# Patient Record
Sex: Female | Born: 1980 | Race: Black or African American | Hispanic: No | Smoking: Current some day smoker
Health system: Southern US, Community
[De-identification: ages and names within clinical notes are randomized; demographics above are authoritative.]

## PROBLEM LIST (undated history)

## (undated) ENCOUNTER — Ambulatory Visit (HOSPITAL_COMMUNITY): Source: Home / Self Care

## (undated) DIAGNOSIS — O009 Unspecified ectopic pregnancy without intrauterine pregnancy: Secondary | ICD-10-CM

## (undated) DIAGNOSIS — R112 Nausea with vomiting, unspecified: Secondary | ICD-10-CM

## (undated) DIAGNOSIS — F53 Postpartum depression: Secondary | ICD-10-CM

## (undated) DIAGNOSIS — Z9889 Other specified postprocedural states: Secondary | ICD-10-CM

## (undated) DIAGNOSIS — O99345 Other mental disorders complicating the puerperium: Secondary | ICD-10-CM

## (undated) DIAGNOSIS — D573 Sickle-cell trait: Secondary | ICD-10-CM

## (undated) DIAGNOSIS — G971 Other reaction to spinal and lumbar puncture: Secondary | ICD-10-CM

## (undated) DIAGNOSIS — K56600 Partial intestinal obstruction, unspecified as to cause: Secondary | ICD-10-CM

---

## 2001-09-21 HISTORY — PX: ECTOPIC PREGNANCY SURGERY: SHX613

## 2002-09-21 HISTORY — PX: UNILATERAL SALPINGECTOMY: SHX6160

## 2013-02-01 LAB — OB RESULTS CONSOLE ANTIBODY SCREEN: Antibody Screen: NEGATIVE

## 2013-02-01 LAB — OB RESULTS CONSOLE RUBELLA ANTIBODY, IGM: Rubella: IMMUNE

## 2013-02-01 LAB — OB RESULTS CONSOLE HEPATITIS B SURFACE ANTIGEN: Hepatitis B Surface Ag: NEGATIVE

## 2013-02-01 LAB — OB RESULTS CONSOLE HIV ANTIBODY (ROUTINE TESTING): HIV: NONREACTIVE

## 2013-02-14 LAB — OB RESULTS CONSOLE RPR: RPR: NONREACTIVE

## 2013-09-27 ENCOUNTER — Inpatient Hospital Stay (HOSPITAL_COMMUNITY)
Admission: AD | Admit: 2013-09-27 | Discharge: 2013-09-27 | Disposition: A | Discharge status: Home / Self Care | Payer: 59 | Source: Ambulatory Visit | Attending: Obstetrics & Gynecology | Admitting: Obstetrics & Gynecology

## 2013-09-27 ENCOUNTER — Emergency Department (HOSPITAL_COMMUNITY)
Admission: EM | Admit: 2013-09-27 | Discharge: 2013-09-27 | Disposition: A | Discharge status: Other Acute Inpatient Hospital | Payer: 59 | Source: Home / Self Care | Attending: Family Medicine | Admitting: Family Medicine

## 2013-09-27 ENCOUNTER — Encounter (HOSPITAL_COMMUNITY): Payer: Self-pay | Admitting: General Practice

## 2013-09-27 ENCOUNTER — Inpatient Hospital Stay (HOSPITAL_COMMUNITY): Payer: 59

## 2013-09-27 ENCOUNTER — Encounter (HOSPITAL_COMMUNITY): Payer: Self-pay | Admitting: Emergency Medicine

## 2013-09-27 DIAGNOSIS — Z3490 Encounter for supervision of normal pregnancy, unspecified, unspecified trimester: Secondary | ICD-10-CM

## 2013-09-27 DIAGNOSIS — O99891 Other specified diseases and conditions complicating pregnancy: Secondary | ICD-10-CM

## 2013-09-27 DIAGNOSIS — M549 Dorsalgia, unspecified: Secondary | ICD-10-CM

## 2013-09-27 DIAGNOSIS — O9989 Other specified diseases and conditions complicating pregnancy, childbirth and the puerperium: Secondary | ICD-10-CM

## 2013-09-27 DIAGNOSIS — O99330 Smoking (tobacco) complicating pregnancy, unspecified trimester: Secondary | ICD-10-CM | POA: Insufficient documentation

## 2013-09-27 DIAGNOSIS — O98819 Other maternal infectious and parasitic diseases complicating pregnancy, unspecified trimester: Secondary | ICD-10-CM | POA: Insufficient documentation

## 2013-09-27 DIAGNOSIS — R109 Unspecified abdominal pain: Secondary | ICD-10-CM | POA: Insufficient documentation

## 2013-09-27 DIAGNOSIS — N898 Other specified noninflammatory disorders of vagina: Secondary | ICD-10-CM

## 2013-09-27 DIAGNOSIS — N939 Abnormal uterine and vaginal bleeding, unspecified: Secondary | ICD-10-CM

## 2013-09-27 DIAGNOSIS — A5900 Urogenital trichomoniasis, unspecified: Secondary | ICD-10-CM

## 2013-09-27 DIAGNOSIS — A5901 Trichomonal vulvovaginitis: Secondary | ICD-10-CM | POA: Insufficient documentation

## 2013-09-27 HISTORY — DX: Other specified postprocedural states: Z98.890

## 2013-09-27 HISTORY — DX: Other reaction to spinal and lumbar puncture: G97.1

## 2013-09-27 HISTORY — DX: Other specified postprocedural states: R11.2

## 2013-09-27 HISTORY — DX: Sickle-cell trait: D57.3

## 2013-09-27 HISTORY — DX: Unspecified ectopic pregnancy without intrauterine pregnancy: O00.90

## 2013-09-27 LAB — ABO/RH: ABO/RH(D): O POS

## 2013-09-27 LAB — WET PREP, GENITAL: Yeast Wet Prep HPF POC: NONE SEEN

## 2013-09-27 LAB — CBC
HCT: 32.6 % — ABNORMAL LOW (ref 36.0–46.0)
Hemoglobin: 10.9 g/dL — ABNORMAL LOW (ref 12.0–15.0)
MCH: 29.4 pg (ref 26.0–34.0)
MCHC: 33.4 g/dL (ref 30.0–36.0)
MCV: 87.9 fL (ref 78.0–100.0)
Platelets: 262 10*3/uL (ref 150–400)
RBC: 3.71 MIL/uL — ABNORMAL LOW (ref 3.87–5.11)
RDW: 13.7 % (ref 11.5–15.5)
WBC: 6.8 10*3/uL (ref 4.0–10.5)

## 2013-09-27 LAB — POCT PREGNANCY, URINE: Preg Test, Ur: POSITIVE — AB

## 2013-09-27 LAB — HCG, QUANTITATIVE, PREGNANCY: HCG, BETA CHAIN, QUANT, S: 110312 m[IU]/mL — AB (ref <5)

## 2013-09-27 MED ORDER — PROMETHAZINE HCL 25 MG PO TABS: 25.0000 mg | ORAL_TABLET | Freq: Four times a day (QID) | ORAL | Status: DC | PRN

## 2013-09-27 MED ORDER — CYCLOBENZAPRINE HCL 10 MG PO TABS: 10.0000 mg | ORAL_TABLET | Freq: Two times a day (BID) | ORAL | Status: DC | PRN

## 2013-09-27 MED ORDER — SODIUM CHLORIDE 0.9 % IV SOLN
Freq: Once | INTRAVENOUS | Status: AC
  Administered 2013-09-27: 15:00:00 via INTRAVENOUS

## 2013-09-27 MED ORDER — PRENATAL MULTIVITAMIN CH: 1.0000 | ORAL_TABLET | Freq: Every day | ORAL | Status: DC

## 2013-09-27 MED ORDER — HYDROMORPHONE HCL PF 1 MG/ML IJ SOLN
1.0000 mg | Freq: Once | INTRAMUSCULAR | Status: AC
  Administered 2013-09-27: 1 mg via INTRAVENOUS
  Filled 2013-09-27: qty 1

## 2013-09-27 MED ORDER — PROMETHAZINE HCL 25 MG PO TABS
25.0000 mg | ORAL_TABLET | Freq: Once | ORAL | Status: AC
  Administered 2013-09-27: 25 mg via ORAL
  Filled 2013-09-27: qty 1

## 2013-09-27 MED ORDER — METRONIDAZOLE 500 MG PO TABS
2000.0000 mg | ORAL_TABLET | Freq: Once | ORAL | Status: AC
  Administered 2013-09-27: 2000 mg via ORAL
  Filled 2013-09-27: qty 4

## 2013-09-27 NOTE — ED Provider Notes (Signed)
CSN: [id number]     Arrival date & time 09/27/13  1251 History   None    Chief Complaint  Patient presents with  . Vaginal Bleeding   (Consider location/radiation/quality/duration/timing/severity/associated sxs/prior Treatment) Patient is a 33 y.o. female presenting with vaginal bleeding. The history is provided by the patient.  Vaginal Bleeding Quality:  Dark red and heavier than menses Severity:  Moderate Onset quality:  Sudden Duration:  1 day Progression:  Improving Chronicity:  New Possible pregnancy: yes (+home pregnancy test )   Associated symptoms: back pain   Associated symptoms comment:  +abdominal pain and cramping Risk factors: hx of ectopic pregnancy and unprotected sex   Risk factors comment:  2 prior ectopic pregnancies   History reviewed. No pertinent past medical history. History reviewed. No pertinent past surgical history. History reviewed. No pertinent family history. History  Substance Use Topics  . Smoking status: Current Every Day Smoker  . Smokeless tobacco: Not on file  . Alcohol Use: No   OB History   Grav Para Term Preterm Abortions TAB SAB Ect Mult Living                 Review of Systems  Genitourinary: Positive for vaginal bleeding.  Musculoskeletal: Positive for back pain.  All other systems reviewed and are negative.    Allergies  Review of patient's allergies indicates no known allergies.  Home Medications  No current outpatient prescriptions on file. BP 125/78  Pulse 72  Temp(Src) 98.4 F (36.9 C) (Oral)  Resp 18  SpO2 100% Physical Exam  Nursing note and vitals reviewed. Constitutional: She is oriented to person, place, and time. She appears well-developed and well-nourished. No distress.  HENT:  Head: Normocephalic and atraumatic.  Eyes: Conjunctivae are normal. No scleral icterus.  Cardiovascular: Normal rate, regular rhythm and normal heart sounds.   Pulmonary/Chest: Effort normal and breath sounds normal. No  respiratory distress.  Abdominal: Soft. Bowel sounds are normal. She exhibits no distension. There is no rigidity, no rebound, no guarding and no CVA tenderness.  Musculoskeletal: Normal range of motion.  Neurological: She is alert and oriented to person, place, and time.  Skin: Skin is warm and dry.  Psychiatric: She has a normal mood and affect. Her behavior is normal.    ED Course  Procedures (including critical care time) Labs Review Labs Reviewed  POCT PREGNANCY, URINE   Imaging Review No results found.  EKG Interpretation    Date/Time:    Ventricular Rate:    PR Interval:    QRS Duration:   QT Interval:    QTC Calculation:   R Axis:     Text Interpretation:              MDM  UPT positive. [facility]. Hemodynamically stable at time of transfer. [facility] Wynelle BourgeoisMarie Williams[facility] Accepting MD: Dr. Colette RibasJim Arnold    Elveta Rape Lee DenningPresson, GeorgiaPA 09/27/13 [phone]

## 2013-09-27 NOTE — Progress Notes (Signed)
Small amount of pink discharge noted

## 2013-09-27 NOTE — Discharge Instructions (Signed)
Trichinosis Trichinosis is an infection caused by eating the raw, or poorly cooked, meat of meat-eating animals that are infected. The infection is caused by eating the encysted larvae (like a small egg with a tiny worm inside) of the nematode (very small worm) called Trichinella spiralis. It is found in the infected meat of these animals. The seriousness of the disease is usually related to the number of larvae eaten. Once the cyst is in the intestines, the larva is released. These tiny worms then enter the small blood vessels in the intestines and travel throughout the body. They can infect all tissues of the body. SYMPTOMS  When the larvae are in the intestine, they can cause diarrhea and abdominal (belly) pain. There may be swelling around the eyes and muscle aches and pains. The diaphragm (breathing muscle between the chest and abdomen), chest muscles between the ribs, and the muscles of the face and the tongue are also affected. This disease is usually self limited. That means you will usually get well in time without treatment. It can rarely result in death only if there are complications from heavy invasion of the heart, lungs, or central nervous system (brain and spinal cord). DIAGNOSIS  Laboratory blood tests are available that can usually make a positive diagnosis. Examining the infected muscle under the microscope may also help with the diagnosis. If laboratory work is performed, make sure you know how you are to obtain the results. It is your responsibility to follow up and obtain your laboratory results. TREATMENT   There is no known treatment for Trichinosis except for treatment of symptoms. Usually anti-inflammatory medications are used.  Only take over-the-counter or prescription medicines for pain, discomfort, or fever as directed by your caregiver. Discontinue immediately if you have stomach upset. Take medicine only as directed by your caregiver.  Thiabendazole is used as a medication  for known exposure. Steroids are also used for severe cases.  Most symptoms will get better on their own within a year without lasting problems. However, you will be infected for the rest of your life. You cannot pass this infection on to another person even with close personal contact. SEEK IMMEDIATE MEDICAL CARE IF:  You develop any new symptoms such as vomiting, severe headache, stiff or painful neck, chest pain, shortness of breath, trouble breathing, or develop pain uncontrolled with medications.  You develop new problems or worsening of the problems that originally brought you in for care. Document Released: 12/14/2000 Document Revised: 11/30/2011 Document Reviewed: 01/12/2008 [facility] Patient Information 2014 [place], [place].

## 2013-09-27 NOTE — MAU Provider Note (Signed)
History     CSN: [id number]  Arrival date and time: 09/27/13 1513   None     Chief Complaint  Patient presents with  . Vaginal Bleeding  . Back Pain   HPI Comments: Patricia Holloway 33 y.o. Arrives via [facility] from [facility]. She has a positive home pregnancy test and started bleeding like menses. Now it is only pink . She had sexual intercourse 3 days ago. She has a history of 2 ectopic pregnancies. She complains of vaginal discharge.     Vaginal Bleeding Associated symptoms include abdominal pain and back pain.  Back Pain Associated symptoms include abdominal pain.      Past Medical History  Diagnosis Date  . Ectopic pregnancy     Past Surgical History  Procedure Laterality Date  . Ectopic pregnancy surgery      No family history on file.  History  Substance Use Topics  . Smoking status: Current Every Day Smoker  . Smokeless tobacco: Not on file  . Alcohol Use: No    Allergies: No Known Allergies  No prescriptions prior to admission    Review of Systems  Constitutional: Negative.   HENT: Negative.   Respiratory: Negative.   Cardiovascular: Negative.   Gastrointestinal: Positive for abdominal pain.  Genitourinary: Positive for vaginal bleeding.       Pink vaginal discharge with odor  Musculoskeletal: Positive for back pain.  Skin: Negative.   Neurological: Negative.   Psychiatric/Behavioral: Negative.    Physical Exam   Last menstrual period 07/24/2013, SpO2 100.00%.  Physical Exam  Constitutional: She is oriented to person, place, and time. She appears well-developed and well-nourished. No distress.  HENT:  Head: Normocephalic and atraumatic.  Eyes: Pupils are equal, round, and reactive to light.  GI: Soft. Bowel sounds are normal. She exhibits no distension. There is tenderness.  Genitourinary:  Genitalia: External: Negative Vagina: Pink, odorous discharge Cervix: closed and long Biman: negative  Neurological: She is alert  and oriented to person, place, and time.  Skin: Skin is warm and dry.  Psychiatric: She has a normal mood and affect. Her behavior is normal. Judgment and thought content normal.   Results for orders placed during the hospital encounter of 09/27/13 (from the past 24 hour(s))  CBC     Status: Abnormal   Collection Time    09/27/13  3:47 PM      Result Value Range   WBC 6.8  4.0 - 10.5 K/uL   RBC 3.71 (*) 3.87 - 5.11 MIL/uL   Hemoglobin 10.9 (*) 12.0 - 15.0 g/dL   HCT 78.2 (*) 95.6 - 21.3 %   MCV 87.9  78.0 - 100.0 fL   MCH 29.4  26.0 - 34.0 pg   MCHC 33.4  30.0 - 36.0 g/dL   RDW 08.6  57.8 - 46.9 %   Platelets 262  150 - 400 K/uL  HCG, QUANTITATIVE, PREGNANCY     Status: Abnormal   Collection Time    09/27/13  3:47 PM      Result Value Range   hCG, Beta Chain, [place] Longest 629528 (*) <5 mIU/mL  ABO/RH     Status: None   Collection Time    09/27/13  3:47 PM      Result Value Range   ABO/RH(D) O POS    WET PREP, GENITAL     Status: Abnormal   Collection Time    09/27/13  4:35 PM      Result Value Range  Yeast Wet Prep HPF POC NONE SEEN  NONE SEEN   Trich, Wet Prep MODERATE (*) NONE SEEN   Clue Cells Wet Prep HPF POC FEW (*) NONE SEEN   WBC, Wet Prep HPF POC FEW (*) NONE SEEN  No results found. [place] Ob Comp Less 14 Wks  09/27/2013   CLINICAL DATA:  Vaginal bleeding.  Obesity.  EXAM: OBSTETRIC <14 WK ULTRASOUND  TECHNIQUE: Transabdominal ultrasound was performed for evaluation of the gestation as well as the maternal uterus and adnexal regions.  COMPARISON:  None.  FINDINGS: Intrauterine gestational sac: Single gestational sac in the fundus.  Yolk sac:  Present.  Embryo:  Present.  Cardiac Activity: Present.  Heart Rate: 153 bpm  CRL:   47  mm   11 w 4 d                  US EDC: 04/14/2014  Maternal uterus/adnexae: Ovaries are unremarkable. No pelvic mass. No free fluid.  IMPRESSION: Single live intrauterine gestation with an estimated gestational age of [redacted] weeks and 4 days. Fetal heart  rate is 153 beats per min.   Electronically Signed   By: Maryclare BeanArt  Hoss M.D.   On: 09/27/2013 18:25     MAU Course  Procedures  MDM  CBC, BHCG, ABORH, GC, Chlamydia, Wet prep  Assessment and Plan   A: Pregnancy/ IUP Vaginal Trich Back Pains   P: Will treat Trich with Flagyl 2 Grams now with phenergan 25 mg Advise FOB to be treated for Trich Advised to start PNV and find prenatal care  Flexeril 10 mg for back pains   Patricia Holloway, Patricia Holloway 09/27/2013, 3:35 PM

## 2013-09-27 NOTE — ED Notes (Addendum)
Late  On  Her  Period         Positive  preg  Test  At home  A  Few  Days  Ago            Some  Low  abd  Pain  Mild     As  Well  As  Some  Back  Pain       Spotting  Some  Now    History  Of   2 previous  Ectopic

## 2013-09-27 NOTE — ED Notes (Signed)
Iv  Ns  tko  20  Angio  r  Arm  1tko   cardiavc  Monitor  Nasal o2  At  2 l /  min

## 2013-09-27 NOTE — MAU Note (Signed)
Pt was transported to MAU via [facility] to r/o ectopic pregnancy. Pt states she's had vaginal bleeding since around 11:45 today when she noticed there was blood on the toilet paper when she went to the bathroom. Pt states she's not seen any other bleeding. Pt states back pain started today that shoots up and down her right leg. Pt states they both started about the same time.

## 2013-09-28 LAB — GC/CHLAMYDIA PROBE AMP
CT Probe RNA: NEGATIVE
GC PROBE AMP APTIMA: NEGATIVE

## 2013-10-01 NOTE — ED Provider Notes (Signed)
Medical screening examination/treatment/procedure(s) were performed by resident physician or non-physician practitioner and as supervising physician I was immediately available for consultation/collaboration.   Itzy Adler DOUGLAS MD.   Renzo Vincelette D Kit Brubacher, MD 10/01/13 1037 

## 2013-12-06 ENCOUNTER — Encounter (HOSPITAL_COMMUNITY): Payer: Self-pay | Admitting: General Practice

## 2013-12-06 ENCOUNTER — Inpatient Hospital Stay (HOSPITAL_COMMUNITY)
Admission: AD | Admit: 2013-12-06 | Discharge: 2013-12-06 | Disposition: A | Discharge status: Home / Self Care | Payer: 59 | Source: Ambulatory Visit | Attending: Obstetrics & Gynecology | Admitting: Obstetrics & Gynecology

## 2013-12-06 DIAGNOSIS — R209 Unspecified disturbances of skin sensation: Secondary | ICD-10-CM | POA: Insufficient documentation

## 2013-12-06 DIAGNOSIS — Z202 Contact with and (suspected) exposure to infections with a predominantly sexual mode of transmission: Secondary | ICD-10-CM | POA: Insufficient documentation

## 2013-12-06 DIAGNOSIS — H538 Other visual disturbances: Secondary | ICD-10-CM | POA: Insufficient documentation

## 2013-12-06 DIAGNOSIS — F17200 Nicotine dependence, unspecified, uncomplicated: Secondary | ICD-10-CM | POA: Insufficient documentation

## 2013-12-06 DIAGNOSIS — A5901 Trichomonal vulvovaginitis: Secondary | ICD-10-CM

## 2013-12-06 DIAGNOSIS — G43909 Migraine, unspecified, not intractable, without status migrainosus: Secondary | ICD-10-CM

## 2013-12-06 LAB — URINALYSIS, ROUTINE W REFLEX MICROSCOPIC
Bilirubin Urine: NEGATIVE
Glucose, UA: NEGATIVE mg/dL
Hgb urine dipstick: NEGATIVE
KETONES UR: 15 mg/dL — AB
LEUKOCYTES UA: NEGATIVE
NITRITE: NEGATIVE
PH: 7 (ref 5.0–8.0)
Protein, ur: NEGATIVE mg/dL
Specific Gravity, Urine: 1.02 (ref 1.005–1.030)
Urobilinogen, UA: 0.2 mg/dL (ref 0.0–1.0)

## 2013-12-06 LAB — RAPID URINE DRUG SCREEN, HOSP PERFORMED
Amphetamines: NOT DETECTED
Barbiturates: NOT DETECTED
Benzodiazepines: NOT DETECTED
Cocaine: NOT DETECTED
Opiates: POSITIVE — AB
Tetrahydrocannabinol: POSITIVE — AB

## 2013-12-06 LAB — WET PREP, GENITAL
Trich, Wet Prep: NONE SEEN
YEAST WET PREP: NONE SEEN

## 2013-12-06 MED ORDER — OXYCODONE-ACETAMINOPHEN 5-325 MG PO TABS
1.0000 | ORAL_TABLET | Freq: Once | ORAL | Status: AC
  Administered 2013-12-06: 1 via ORAL
  Filled 2013-12-06: qty 1

## 2013-12-06 MED ORDER — METRONIDAZOLE 500 MG PO TABS: 2000.0000 mg | ORAL_TABLET | Freq: Once | ORAL | Status: DC

## 2013-12-06 MED ORDER — PROMETHAZINE HCL 25 MG PO TABS: 25.0000 mg | ORAL_TABLET | Freq: Four times a day (QID) | ORAL | Status: DC | PRN

## 2013-12-06 MED ORDER — PROMETHAZINE HCL 25 MG PO TABS
25.0000 mg | ORAL_TABLET | Freq: Once | ORAL | Status: AC
  Administered 2013-12-06: 25 mg via ORAL
  Filled 2013-12-06: qty 1

## 2013-12-06 NOTE — MAU Note (Signed)
Pt states R leg is numb, leg gave way this a.m. When she got up to the BR.  Feels like she has a pinched nerve in her R flank area, then her leg goes numb.  Has HA & blurry vision, also some pelvic pressure.  Denies bleeding or discharge.

## 2013-12-06 NOTE — Discharge Instructions (Signed)
Migraine Headache A migraine headache is an intense, throbbing pain on one or both sides of your head. A migraine can last for 30 minutes to several hours. CAUSES  The exact cause of a migraine headache is not always known. However, a migraine may be caused when nerves in the brain become irritated and release chemicals that cause inflammation. This causes pain. Certain things may also trigger migraines, such as:  Alcohol.  Smoking.  Stress.  Menstruation.  Aged cheeses.  Foods or drinks that contain nitrates, glutamate, aspartame, or tyramine.  Lack of sleep.  Chocolate.  Caffeine.  Hunger.  Physical exertion.  Fatigue.  Medicines used to treat chest pain (nitroglycerine), birth control pills, estrogen, and some blood pressure medicines. SIGNS AND SYMPTOMS  Pain on one or both sides of your head.  Pulsating or throbbing pain.  Severe pain that prevents daily activities.  Pain that is aggravated by any physical activity.  Nausea, vomiting, or both.  Dizziness.  Pain with exposure to bright lights, loud noises, or activity.  General sensitivity to bright lights, loud noises, or smells. Before you get a migraine, you may get warning signs that a migraine is coming (aura). An aura may include:  Seeing flashing lights.  Seeing bright spots, halos, or zig-zag lines.  Having tunnel vision or blurred vision.  Having feelings of numbness or tingling.  Having trouble talking.  Having muscle weakness. DIAGNOSIS  A migraine headache is often diagnosed based on:  Symptoms.  Physical exam.  A CT scan or MRI of your head. These imaging tests cannot diagnose migraines, but they can help rule out other causes of headaches. TREATMENT Medicines may be given for pain and nausea. Medicines can also be given to help prevent recurrent migraines.  HOME CARE INSTRUCTIONS  Only take over-the-counter or prescription medicines for pain or discomfort as directed by your  health care provider. The use of long-term narcotics is not recommended.  Lie down in a dark, quiet room when you have a migraine.  Keep a journal to find out what may trigger your migraine headaches. For example, write down:  What you eat and drink.  How much sleep you get.  Any change to your diet or medicines.  Limit alcohol consumption.  Quit smoking if you smoke.  Get 7 9 hours of sleep, or as recommended by your health care provider.  Limit stress.  Keep lights dim if bright lights bother you and make your migraines worse. SEEK IMMEDIATE MEDICAL CARE IF:   Your migraine becomes severe.  You have a fever.  You have a stiff neck.  You have vision loss.  You have muscular weakness or loss of muscle control.  You start losing your balance or have trouble walking.  You feel faint or pass out.  You have severe symptoms that are different from your first symptoms. MAKE SURE YOU:   Understand these instructions.  Will watch your condition.  Will get help right away if you are not doing well or get worse. Document Released: 09/07/2005 Document Revised: 06/28/2013 Document Reviewed: 05/15/2013 [facility] Patient Information 2014 [place], [place]. Trichomoniasis Trichomoniasis is an infection, caused by the Trichomonas organism, that affects both women and men. In women, the outer female genitalia and the vagina are affected. In men, the penis is mainly affected, but the prostate and other reproductive organs can also be involved. Trichomoniasis is a sexually transmitted disease (STD) and is most often passed to another person through sexual contact. The majority of people who  get trichomoniasis do so from a sexual encounter and are also at risk for other STDs. CAUSES   Sexual intercourse with an infected partner.  It can be present in swimming pools or hot tubs. SYMPTOMS   Abnormal gray-green frothy vaginal discharge in women.  Vaginal itching and irritation in  women.  Itching and irritation of the area outside the vagina in women.  Penile discharge with or without pain in males.  Inflammation of the urethra (urethritis), causing painful urination.  Bleeding after sexual intercourse. RELATED COMPLICATIONS  Pelvic inflammatory disease.  Infection of the uterus (endometritis).  Infertility.  Tubal (ectopic) pregnancy.  It can be associated with other STDs, including gonorrhea and chlamydia, hepatitis B, and HIV. COMPLICATIONS DURING PREGNANCY  Early (premature) delivery.  Premature rupture of the membranes (PROM).  Low birth weight. DIAGNOSIS   Visualization of Trichomonas under the microscope from the vagina discharge.  Ph of the vagina greater than 4.5, tested with a test tape.  Trich Rapid Test.  Culture of the organism, but this is not usually needed.  It may be found on a Pap test.  Having a "strawberry cervix,"which means the cervix looks very red like a strawberry. TREATMENT   You may be given medication to fight the infection. Inform your caregiver if you could be or are pregnant. Some medications used to treat the infection should not be taken during pregnancy.  Over-the-counter medications or creams to decrease itching or irritation may be recommended.  Your sexual partner will need to be treated if infected. HOME CARE INSTRUCTIONS   Take all medication prescribed by your caregiver.  Take over-the-counter medication for itching or irritation as directed by your caregiver.  Do not have sexual intercourse while you have the infection.  Do not douche or wear tampons.  Discuss your infection with your partner, as your partner may have acquired the infection from you. Or, your partner may have been the person who transmitted the infection to you.  Have your sex partner examined and treated if necessary.  Practice safe, informed, and protected sex.  See your caregiver for other STD testing. SEEK MEDICAL CARE  IF:   You still have symptoms after you finish the medication.  You have an oral temperature above 102 F (38.9 C).  You develop belly (abdominal) pain.  You have pain when you urinate.  You have bleeding after sexual intercourse.  You develop a rash.  The medication makes you sick or makes you throw up (vomit). Document Released: 03/03/2001 Document Revised: 11/30/2011 Document Reviewed: 03/29/2009 [facility] Patient Information 2014 [place], [place].

## 2013-12-06 NOTE — MAU Provider Note (Signed)
History     CSN: [id number]  Arrival date and time: 12/06/13 4098   First Provider Initiated Contact with Patient 12/06/13 0935      Chief Complaint  Patient presents with  . leg numbness   . Headache  . Blurred Vision   HPI Comments: Ludwika Rodd 33 y.o. [id number] presents to MAU with migraine. She has a history on migraines. She has some blurred vision, numbness and tingling and these all go along with her migraine. She has not established prenatal care. She is not having any concerns with pregnancy. She admits that her partner has not been treated for trich and they have been sleeping together.  Headache  Associated symptoms include blurred vision, nausea and tingling.      Past Medical History  Diagnosis Date  . Ectopic pregnancy   . PONV (postoperative nausea and vomiting)   . Spinal headache   . Sickle cell trait     Past Surgical History  Procedure Laterality Date  . Ectopic pregnancy surgery    . Cesarean section    . Unilateral salpingectomy Right 2004    Family History  Problem Relation Age of Onset  . Heart disease Mother   . Sickle cell trait Father     History  Substance Use Topics  . Smoking status: Current Every Day Smoker  . Smokeless tobacco: Never Used  . Alcohol Use: No    Allergies: No Known Allergies  Prescriptions prior to admission  Medication Sig Dispense Refill  . cyclobenzaprine (FLEXERIL) 10 MG tablet Take 1 tablet (10 mg total) by mouth 2 (two) times daily as needed for muscle spasms.  20 tablet  0  . Prenatal Vit-Fe Fumarate-FA (PRENATAL MULTIVITAMIN) TABS tablet Take 1 tablet by mouth daily at 12 noon.  30 tablet  9  . promethazine (PHENERGAN) 25 MG tablet Take 1 tablet (25 mg total) by mouth every 6 (six) hours as needed for nausea or vomiting.  30 tablet  0    Review of Systems  Eyes: Positive for blurred vision.  Gastrointestinal: Positive for nausea.  Neurological: Positive for tingling and headaches.   Psychiatric/Behavioral: The patient is nervous/anxious.    Physical Exam   Blood pressure 117/65, pulse 94, temperature 98.8 F (37.1 C), temperature source Oral, resp. rate 20, height 5\' 3"  (1.6 m), weight 108.41 kg (239 lb), last menstrual period 07/24/2013.  Physical Exam  Constitutional: She is oriented to person, place, and time. She appears well-developed and well-nourished. No distress.  HENT:  Head: Normocephalic and atraumatic.  Eyes: Pupils are equal, round, and reactive to light.  Sensitive to light  Cardiovascular: Normal rate, regular rhythm and normal heart sounds.   Respiratory: Effort normal and breath sounds normal.  GI: Soft. Bowel sounds are normal.  + FHT  Genitourinary:  Genital: External: negative Vaginal:thin white discharge Cervix:closed and thick Bimanual:negative   Musculoskeletal: Normal range of motion.  Neurological: She is alert and oriented to person, place, and time.  Skin: Skin is warm and dry.  Psychiatric: Her mood appears anxious.   Results for orders placed during the hospital encounter of 12/06/13 (from the past 24 hour(s))  URINALYSIS, ROUTINE W REFLEX MICROSCOPIC     Status: Abnormal   Collection Time    12/06/13  9:20 AM      Result Value Ref Range   Color, Urine YELLOW  YELLOW   APPearance CLEAR  CLEAR   Specific Gravity, Urine 1.020  1.005 - 1.030   pH 7.0  5.0 -  8.0   Glucose, UA NEGATIVE  NEGATIVE mg/dL   Hgb urine dipstick NEGATIVE  NEGATIVE   Bilirubin Urine NEGATIVE  NEGATIVE   Ketones, ur 15 (*) NEGATIVE mg/dL   Protein, ur NEGATIVE  NEGATIVE mg/dL   Urobilinogen, UA 0.2  0.0 - 1.0 mg/dL   Nitrite NEGATIVE  NEGATIVE   Leukocytes, UA NEGATIVE  NEGATIVE     MAU Course  Procedures  MDM  Percocet/ phenergan now Wet Prep/ GC/ Chlamydia/ UDS Pt needed to leave prior to wet prep results  Assessment and Plan   A: Migraine headache Trich reexposure  P: Tylenol # 3/ Phenergan for migraines Flagyl 2 Grams with 1  refill for partner who has NKDA Needs to establish prenatal care asap/ list given Advised to increase fluids  Carolynn ServeBarefoot, Kamsiyochukwu Spickler Miller 12/06/2013, 9:37 AM

## 2013-12-07 LAB — GC/CHLAMYDIA PROBE AMP
CT Probe RNA: NEGATIVE
GC Probe RNA: NEGATIVE

## 2014-03-11 ENCOUNTER — Encounter (HOSPITAL_COMMUNITY): Payer: Self-pay | Admitting: *Deleted

## 2014-03-11 ENCOUNTER — Inpatient Hospital Stay (HOSPITAL_COMMUNITY)
Admission: AD | Admit: 2014-03-11 | Discharge: 2014-03-11 | Disposition: A | Discharge status: Home / Self Care | Payer: 59 | Source: Ambulatory Visit | Attending: Obstetrics and Gynecology | Admitting: Obstetrics and Gynecology

## 2014-03-11 DIAGNOSIS — M549 Dorsalgia, unspecified: Secondary | ICD-10-CM

## 2014-03-11 DIAGNOSIS — M545 Low back pain, unspecified: Secondary | ICD-10-CM | POA: Insufficient documentation

## 2014-03-11 DIAGNOSIS — D573 Sickle-cell trait: Secondary | ICD-10-CM | POA: Insufficient documentation

## 2014-03-11 DIAGNOSIS — O9989 Other specified diseases and conditions complicating pregnancy, childbirth and the puerperium: Secondary | ICD-10-CM

## 2014-03-11 DIAGNOSIS — O99019 Anemia complicating pregnancy, unspecified trimester: Secondary | ICD-10-CM | POA: Insufficient documentation

## 2014-03-11 DIAGNOSIS — O99891 Other specified diseases and conditions complicating pregnancy: Secondary | ICD-10-CM | POA: Insufficient documentation

## 2014-03-11 LAB — URINALYSIS, ROUTINE W REFLEX MICROSCOPIC
Bilirubin Urine: NEGATIVE
Glucose, UA: NEGATIVE mg/dL
HGB URINE DIPSTICK: NEGATIVE
KETONES UR: NEGATIVE mg/dL
Leukocytes, UA: NEGATIVE
Nitrite: NEGATIVE
Protein, ur: NEGATIVE mg/dL
Specific Gravity, Urine: 1.015 (ref 1.005–1.030)
UROBILINOGEN UA: 1 mg/dL (ref 0.0–1.0)
pH: 7 (ref 5.0–8.0)

## 2014-03-11 MED ORDER — CYCLOBENZAPRINE HCL 10 MG PO TABS
10.0000 mg | ORAL_TABLET | Freq: Once | ORAL | Status: AC
  Administered 2014-03-11: 10 mg via ORAL
  Filled 2014-03-11: qty 1

## 2014-03-11 NOTE — MAU Provider Note (Signed)
History     CSN: [id number]  Arrival date and time: 03/11/14 0908   First Provider Initiated Contact with Patient 03/11/14 [phone]      Chief Complaint  Patient presents with  . Back Pain   Back Pain    Patricia Holloway is a 33 y.o. [phone] at [id number] who presents today with back pain. She states that upon waking this morning she was having 6/10 lower back pain. She has not taken anything for it at this time. She denies any fever, urinary symptoms, VB, LOF or contractions. She confirms fetal movement. She denies any complications with this pregnancy, and her next appointment in the office is on 03/21/14. She states that she is scheduled for repeat c-section on 04/09/14.   Past Medical History  Diagnosis Date  . Ectopic pregnancy   . PONV (postoperative nausea and vomiting)   . Spinal headache   . Sickle cell trait     Past Surgical History  Procedure Laterality Date  . Ectopic pregnancy surgery    . Cesarean section    . Unilateral salpingectomy Right 2004    Family History  Problem Relation Age of Onset  . Heart disease Mother   . Sickle cell trait Father     History  Substance Use Topics  . Smoking status: Current Every Day Smoker  . Smokeless tobacco: Never Used  . Alcohol Use: No    Allergies: No Known Allergies  Prescriptions prior to admission  Medication Sig Dispense Refill  . acetaminophen-codeine (TYLENOL #3) 300-30 MG per tablet Take 2 tablets by mouth every 4 (four) hours as needed for moderate pain.      Marland Kitchen. amoxicillin (AMOXIL) 500 MG capsule Take 500 mg by mouth every 6 (six) hours.      . metroNIDAZOLE (FLAGYL) 500 MG tablet Take 4 tablets (2,000 mg total) by mouth once.  4 tablet  1  . promethazine (PHENERGAN) 25 MG tablet Take 1 tablet (25 mg total) by mouth every 6 (six) hours as needed for nausea or vomiting.  30 tablet  0    Review of Systems  Musculoskeletal: Positive for back pain.   Physical Exam   Blood pressure 104/54, pulse 72, temperature  98.1 F (36.7 C), temperature source Oral, resp. rate 20, height 5\' 5"  (1.651 m), weight 106.595 kg (235 lb), last menstrual period 07/24/2013.  Physical Exam  Nursing note and vitals reviewed. Constitutional: She is oriented to person, place, and time. She appears well-developed and well-nourished. No distress.  Cardiovascular: Normal rate.   Respiratory: Effort normal.  GI: Soft. There is no tenderness. There is no rebound.  Genitourinary:  No CVA tenderness Cervix: 1 at external os, but closed internally/thick/-3  Neurological: She is alert and oriented to person, place, and time.  Skin: Skin is warm and dry.  Psychiatric: She has a normal mood and affect.  Unable to collect FFN as patient reports having intercourse in the last 24 hours.   FHT: 140, moderate with accels, no decels Toco: no UCs  MAU Course  Procedures   1030: Patient states that she feels better, and would like to go home 1034: D/W Dr. Claiborne Billingsallahan, ok for dc home.  Assessment and Plan   1. Back pain affecting pregnancy in third trimester   UA still pending, will culture if needed Comfort measures reviewed Fetal kick counts PTL precautions Return to MAU as needed  Follow-up Information   Follow up with [facility] (As scheduled)  Contact information:   [address] [place] [phone]       Tawnya CrookHogan, Heather Donovan 03/11/2014, 9:54 AM

## 2014-03-11 NOTE — Discharge Instructions (Signed)
Third Trimester of Pregnancy The third trimester is from week 29 through week 42, months 7 through 9. The third trimester is a time when the fetus is growing rapidly. At the end of the ninth month, the fetus is about 20 inches in length and weighs 6-10 pounds.  BODY CHANGES Your body goes through many changes during pregnancy. The changes vary from woman to woman.   Your weight will continue to increase. You can expect to gain 25-35 pounds (11-16 kg) by the end of the pregnancy.  You may begin to get stretch marks on your hips, abdomen, and breasts.  You may urinate more often because the fetus is moving lower into your pelvis and pressing on your bladder.  You may develop or continue to have heartburn as a result of your pregnancy.  You may develop constipation because certain hormones are causing the muscles that push waste through your intestines to slow down.  You may develop hemorrhoids or swollen, bulging veins (varicose veins).  You may have pelvic pain because of the weight gain and pregnancy hormones relaxing your joints between the bones in your pelvis. Backaches may result from overexertion of the muscles supporting your posture.  You may have changes in your hair. These can include thickening of your hair, rapid growth, and changes in texture. Some women also have hair loss during or after pregnancy, or hair that feels dry or thin. Your hair will most likely return to normal after your baby is born.  Your breasts will continue to grow and be tender. A yellow discharge may leak from your breasts called colostrum.  Your belly button may stick out.  You may feel short of breath because of your expanding uterus.  You may notice the fetus "dropping," or moving lower in your abdomen.  You may have a bloody mucus discharge. This usually occurs a few days to a week before labor begins.  Your cervix becomes thin and soft (effaced) near your due date. WHAT TO EXPECT AT YOUR PRENATAL  EXAMS  You will have prenatal exams every 2 weeks until week 36. Then, you will have weekly prenatal exams. During a routine prenatal visit:  You will be weighed to make sure you and the fetus are growing normally.  Your blood pressure is taken.  Your abdomen will be measured to track your baby's growth.  The fetal heartbeat will be listened to.  Any test results from the previous visit will be discussed.  You may have a cervical check near your due date to see if you have effaced. At around 36 weeks, your caregiver will check your cervix. At the same time, your caregiver will also perform a test on the secretions of the vaginal tissue. This test is to determine if a type of bacteria, Group B streptococcus, is present. Your caregiver will explain this further. Your caregiver may ask you:  What your birth plan is.  How you are feeling.  If you are feeling the baby move.  If you have had any abnormal symptoms, such as leaking fluid, bleeding, severe headaches, or abdominal cramping.  If you have any questions. Other tests or screenings that may be performed during your third trimester include:  Blood tests that check for low iron levels (anemia).  Fetal testing to check the health, activity level, and growth of the fetus. Testing is done if you have certain medical conditions or if there are problems during the pregnancy. FALSE LABOR You may feel small, irregular contractions that   eventually go away. These are called Braxton Hicks contractions, or false labor. Contractions may last for hours, days, or even weeks before true labor sets in. If contractions come at regular intervals, intensify, or become painful, it is best to be seen by your caregiver.  SIGNS OF LABOR   Menstrual-like cramps.  Contractions that are 5 minutes apart or less.  Contractions that start on the top of the uterus and spread down to the lower abdomen and back.  A sense of increased pelvic pressure or back  pain.  A watery or bloody mucus discharge that comes from the vagina. If you have any of these signs before the 37th week of pregnancy, call your caregiver right away. You need to go to the hospital to get checked immediately. HOME CARE INSTRUCTIONS   Avoid all smoking, herbs, alcohol, and unprescribed drugs. These chemicals affect the formation and growth of the baby.  Follow your caregiver's instructions regarding medicine use. There are medicines that are either safe or unsafe to take during pregnancy.  Exercise only as directed by your caregiver. Experiencing uterine cramps is a good sign to stop exercising.  Continue to eat regular, healthy meals.  Wear a good support bra for breast tenderness.  Do not use hot tubs, steam rooms, or saunas.  Wear your seat belt at all times when driving.  Avoid raw meat, uncooked cheese, cat litter boxes, and soil used by cats. These carry germs that can cause birth defects in the baby.  Take your prenatal vitamins.  Try taking a stool softener (if your caregiver approves) if you develop constipation. Eat more high-fiber foods, such as fresh vegetables or fruit and whole grains. Drink plenty of fluids to keep your urine clear or pale yellow.  Take warm sitz baths to soothe any pain or discomfort caused by hemorrhoids. Use hemorrhoid cream if your caregiver approves.  If you develop varicose veins, wear support hose. Elevate your feet for 15 minutes, 3-4 times a day. Limit salt in your diet.  Avoid heavy lifting, wear low heal shoes, and practice good posture.  Rest a lot with your legs elevated if you have leg cramps or low back pain.  Visit your dentist if you have not gone during your pregnancy. Use a soft toothbrush to brush your teeth and be gentle when you floss.  A sexual relationship may be continued unless your caregiver directs you otherwise.  Do not travel far distances unless it is absolutely necessary and only with the approval  of your caregiver.  Take prenatal classes to understand, practice, and ask questions about the labor and delivery.  Make a trial run to the hospital.  Pack your hospital bag.  Prepare the baby's nursery.  Continue to go to all your prenatal visits as directed by your caregiver. SEEK MEDICAL CARE IF:  You are unsure if you are in labor or if your water has broken.  You have dizziness.  You have mild pelvic cramps, pelvic pressure, or nagging pain in your abdominal area.  You have persistent nausea, vomiting, or diarrhea.  You have a bad smelling vaginal discharge.  You have pain with urination. SEEK IMMEDIATE MEDICAL CARE IF:   You have a fever.  You are leaking fluid from your vagina.  You have spotting or bleeding from your vagina.  You have severe abdominal cramping or pain.  You have rapid weight loss or gain.  You have shortness of breath with chest pain.  You notice sudden or extreme swelling   of your face, hands, ankles, feet, or legs.  You have not felt your baby move in over an hour.  You have severe headaches that do not go away with medicine.  You have vision changes. Document Released: 09/01/2001 Document Revised: 09/12/2013 Document Reviewed: 11/08/2012 ExitCare Patient Information 2015 [employer]. This information is not intended to replace advice given to you by your health care provider. Make sure you discuss any questions you have with your health care provider.  

## 2014-03-11 NOTE — MAU Note (Signed)
Patient presents with complaint of lower back pain since 0730 today.

## 2014-03-27 ENCOUNTER — Encounter (HOSPITAL_COMMUNITY): Payer: Self-pay | Admitting: Pharmacist

## 2014-04-03 ENCOUNTER — Encounter (HOSPITAL_COMMUNITY): Payer: Self-pay

## 2014-04-04 ENCOUNTER — Encounter (HOSPITAL_COMMUNITY): Payer: Self-pay

## 2014-04-04 ENCOUNTER — Encounter (HOSPITAL_COMMUNITY)
Admission: RE | Admit: 2014-04-04 | Discharge: 2014-04-04 | Disposition: A | Discharge status: Home / Self Care | Payer: 59 | Source: Ambulatory Visit | Attending: Obstetrics and Gynecology | Admitting: Obstetrics and Gynecology

## 2014-04-04 LAB — RPR

## 2014-04-04 LAB — CBC
HEMATOCRIT: 32 % — AB (ref 36.0–46.0)
Hemoglobin: 10.4 g/dL — ABNORMAL LOW (ref 12.0–15.0)
MCH: 29.1 pg (ref 26.0–34.0)
MCHC: 32.5 g/dL (ref 30.0–36.0)
MCV: 89.6 fL (ref 78.0–100.0)
PLATELETS: 198 10*3/uL (ref 150–400)
RBC: 3.57 MIL/uL — ABNORMAL LOW (ref 3.87–5.11)
RDW: 15.1 % (ref 11.5–15.5)
WBC: 5.5 10*3/uL (ref 4.0–10.5)

## 2014-04-04 LAB — TYPE AND SCREEN
ABO/RH(D): O POS
Antibody Screen: NEGATIVE

## 2014-04-04 NOTE — Patient Instructions (Signed)
20 Wendie SimmerYvonne F Balducci  04/04/2014   Your procedure is scheduled on:  04/06/14  Enter through the Main Entr[facility] of [facility] at 6 AM.  Pick up the phone at the desk and dial [phone].   Call this number if you have problems the morning of surgery: [phone]   Remember:   Do not eat food:After Midnight.  Do not drink clear liquids: After Midnight.  Take these medicines the morning of surgery with A SIP OF WATER: NA   Do not wear jewelry, make-up or nail polish.  Do not wear lotions, powders, or perfumes. You may wear deodorant.  Do not shave 48 hours prior to surgery.  Do not bring valuables to the hospital.  [facility] is not   responsible for any belongings or valuables brought to the hospital.  Contacts, dentures or bridgework may not be worn into surgery.  Leave suitcase in the car. After surgery it may be brought to your room.  For patients admitted to the hospital, checkout time is 11:00 AM the day of              discharge.   Patients discharged the day of surgery will not be allowed to drive             home.  Name and phone number of your driver: NA  Special Instructions:      Please read over the following fact sheets that you were given:   Surgical Site Infection Prevention

## 2014-04-06 ENCOUNTER — Inpatient Hospital Stay (HOSPITAL_COMMUNITY)
Admission: RE | Admit: 2014-04-06 | Discharge: 2014-04-09 | DRG: 766 | Disposition: A | Discharge status: Home / Self Care | Payer: 59 | Source: Ambulatory Visit | Attending: Obstetrics & Gynecology | Admitting: Obstetrics & Gynecology

## 2014-04-06 ENCOUNTER — Encounter (HOSPITAL_COMMUNITY): Payer: Self-pay | Admitting: Anesthesiology

## 2014-04-06 ENCOUNTER — Encounter (HOSPITAL_COMMUNITY)
Admission: RE | Disposition: A | Discharge status: Home / Self Care | Payer: Self-pay | Source: Ambulatory Visit | Attending: Obstetrics & Gynecology

## 2014-04-06 ENCOUNTER — Inpatient Hospital Stay (HOSPITAL_COMMUNITY): Payer: 59 | Admitting: Anesthesiology

## 2014-04-06 ENCOUNTER — Encounter (HOSPITAL_COMMUNITY): Payer: 59 | Admitting: Anesthesiology

## 2014-04-06 DIAGNOSIS — O9902 Anemia complicating childbirth: Secondary | ICD-10-CM | POA: Diagnosis present

## 2014-04-06 DIAGNOSIS — D573 Sickle-cell trait: Secondary | ICD-10-CM | POA: Diagnosis present

## 2014-04-06 DIAGNOSIS — O34219 Maternal care for unspecified type scar from previous cesarean delivery: Principal | ICD-10-CM | POA: Diagnosis present

## 2014-04-06 DIAGNOSIS — O99334 Smoking (tobacco) complicating childbirth: Secondary | ICD-10-CM | POA: Diagnosis present

## 2014-04-06 SURGERY — Surgical Case
Anesthesia: Spinal | Site: Abdomen

## 2014-04-06 MED ORDER — SIMETHICONE 80 MG PO CHEW
80.0000 mg | CHEWABLE_TABLET | Freq: Three times a day (TID) | ORAL | Status: DC
  Administered 2014-04-06 – 2014-04-09 (×8): 80 mg via ORAL
  Filled 2014-04-06 (×8): qty 1

## 2014-04-06 MED ORDER — NALBUPHINE HCL 10 MG/ML IJ SOLN: 5.0000 mg | INTRAMUSCULAR | Status: DC | PRN

## 2014-04-06 MED ORDER — KETOROLAC TROMETHAMINE 60 MG/2ML IM SOLN
60.0000 mg | Freq: Once | INTRAMUSCULAR | Status: AC | PRN
  Administered 2014-04-06: 60 mg via INTRAMUSCULAR

## 2014-04-06 MED ORDER — FERROUS SULFATE 325 (65 FE) MG PO TABS
325.0000 mg | ORAL_TABLET | Freq: Two times a day (BID) | ORAL | Status: DC
  Administered 2014-04-06 – 2014-04-09 (×6): 325 mg via ORAL
  Filled 2014-04-06 (×6): qty 1

## 2014-04-06 MED ORDER — NALOXONE HCL 0.4 MG/ML IJ SOLN: 0.4000 mg | INTRAMUSCULAR | Status: DC | PRN

## 2014-04-06 MED ORDER — ONDANSETRON HCL 4 MG PO TABS: 4.0000 mg | ORAL_TABLET | ORAL | Status: DC | PRN

## 2014-04-06 MED ORDER — IBUPROFEN 600 MG PO TABS
600.0000 mg | ORAL_TABLET | Freq: Four times a day (QID) | ORAL | Status: DC
  Administered 2014-04-07 – 2014-04-09 (×10): 600 mg via ORAL
  Filled 2014-04-06 (×10): qty 1

## 2014-04-06 MED ORDER — OXYTOCIN 10 UNIT/ML IJ SOLN
40.0000 [IU] | INTRAVENOUS | Status: DC | PRN
  Administered 2014-04-06: 40 [IU] via INTRAVENOUS

## 2014-04-06 MED ORDER — PHENYLEPHRINE HCL 10 MG/ML IJ SOLN
INTRAMUSCULAR | Status: AC
  Filled 2014-04-06: qty 1

## 2014-04-06 MED ORDER — KETOROLAC TROMETHAMINE 60 MG/2ML IM SOLN
INTRAMUSCULAR | Status: AC
  Administered 2014-04-06: 60 mg via INTRAMUSCULAR
  Filled 2014-04-06: qty 2

## 2014-04-06 MED ORDER — ONDANSETRON HCL 4 MG/2ML IJ SOLN
INTRAMUSCULAR | Status: AC
  Filled 2014-04-06: qty 2

## 2014-04-06 MED ORDER — ONDANSETRON HCL 4 MG/2ML IJ SOLN
INTRAMUSCULAR | Status: DC | PRN
  Administered 2014-04-06: 4 mg via INTRAVENOUS

## 2014-04-06 MED ORDER — LACTATED RINGERS IV SOLN
INTRAVENOUS | Status: DC | PRN
  Administered 2014-04-06: 08:00:00 via INTRAVENOUS

## 2014-04-06 MED ORDER — LANOLIN HYDROUS EX OINT: 1.0000 "application " | TOPICAL_OINTMENT | CUTANEOUS | Status: DC | PRN

## 2014-04-06 MED ORDER — NALBUPHINE HCL 10 MG/ML IJ SOLN
5.0000 mg | INTRAMUSCULAR | Status: DC | PRN
  Administered 2014-04-07: 10 mg via SUBCUTANEOUS
  Filled 2014-04-06: qty 1

## 2014-04-06 MED ORDER — SENNOSIDES-DOCUSATE SODIUM 8.6-50 MG PO TABS
2.0000 | ORAL_TABLET | ORAL | Status: DC
  Administered 2014-04-07 – 2014-04-09 (×3): 2 via ORAL
  Filled 2014-04-06 (×3): qty 2

## 2014-04-06 MED ORDER — KETOROLAC TROMETHAMINE 30 MG/ML IJ SOLN: 30.0000 mg | Freq: Four times a day (QID) | INTRAMUSCULAR | Status: AC | PRN

## 2014-04-06 MED ORDER — FENTANYL CITRATE 0.05 MG/ML IJ SOLN
25.0000 ug | INTRAMUSCULAR | Status: DC | PRN
  Administered 2014-04-06 (×2): 50 ug via INTRAVENOUS

## 2014-04-06 MED ORDER — CEFAZOLIN SODIUM-DEXTROSE 2-3 GM-% IV SOLR
INTRAVENOUS | Status: AC
  Administered 2014-04-06: 2 g via INTRAVENOUS
  Filled 2014-04-06: qty 50

## 2014-04-06 MED ORDER — METOCLOPRAMIDE HCL 5 MG/ML IJ SOLN: 10.0000 mg | Freq: Three times a day (TID) | INTRAMUSCULAR | Status: DC | PRN

## 2014-04-06 MED ORDER — SIMETHICONE 80 MG PO CHEW
80.0000 mg | CHEWABLE_TABLET | ORAL | Status: DC
  Administered 2014-04-07 – 2014-04-09 (×3): 80 mg via ORAL
  Filled 2014-04-06 (×3): qty 1

## 2014-04-06 MED ORDER — OXYCODONE-ACETAMINOPHEN 5-325 MG PO TABS
1.0000 | ORAL_TABLET | ORAL | Status: DC | PRN
  Administered 2014-04-07 (×2): 2 via ORAL
  Administered 2014-04-07 (×2): 1 via ORAL
  Administered 2014-04-07 – 2014-04-09 (×8): 2 via ORAL
  Filled 2014-04-06 (×5): qty 2
  Filled 2014-04-06: qty 1
  Filled 2014-04-06 (×2): qty 2
  Filled 2014-04-06: qty 1
  Filled 2014-04-06 (×3): qty 2

## 2014-04-06 MED ORDER — TETANUS-DIPHTH-ACELL PERTUSSIS 5-2.5-18.5 LF-MCG/0.5 IM SUSP: 0.5000 mL | Freq: Once | INTRAMUSCULAR | Status: DC

## 2014-04-06 MED ORDER — FENTANYL CITRATE 0.05 MG/ML IJ SOLN
INTRAMUSCULAR | Status: AC
  Filled 2014-04-06: qty 2

## 2014-04-06 MED ORDER — PHENYLEPHRINE 8 MG IN D5W 100 ML (0.08MG/ML) PREMIX OPTIME
INJECTION | INTRAVENOUS | Status: DC | PRN
  Administered 2014-04-06: 60 ug/min via INTRAVENOUS

## 2014-04-06 MED ORDER — ONDANSETRON HCL 4 MG/2ML IJ SOLN: 4.0000 mg | INTRAMUSCULAR | Status: DC | PRN

## 2014-04-06 MED ORDER — MORPHINE SULFATE (PF) 0.5 MG/ML IJ SOLN
INTRAMUSCULAR | Status: DC | PRN
  Administered 2014-04-06: .15 mg via INTRATHECAL

## 2014-04-06 MED ORDER — WITCH HAZEL-GLYCERIN EX PADS: 1.0000 "application " | MEDICATED_PAD | CUTANEOUS | Status: DC | PRN

## 2014-04-06 MED ORDER — LACTATED RINGERS IV SOLN
INTRAVENOUS | Status: DC
  Administered 2014-04-06: 18:00:00 via INTRAVENOUS

## 2014-04-06 MED ORDER — LACTATED RINGERS IV SOLN
INTRAVENOUS | Status: DC
  Administered 2014-04-06 (×2): via INTRAVENOUS

## 2014-04-06 MED ORDER — BUPIVACAINE IN DEXTROSE 0.75-8.25 % IT SOLN
INTRATHECAL | Status: AC
  Filled 2014-04-06: qty 2

## 2014-04-06 MED ORDER — SIMETHICONE 80 MG PO CHEW
80.0000 mg | CHEWABLE_TABLET | ORAL | Status: DC | PRN
  Filled 2014-04-06: qty 1

## 2014-04-06 MED ORDER — DIPHENHYDRAMINE HCL 25 MG PO CAPS: 25.0000 mg | ORAL_CAPSULE | Freq: Four times a day (QID) | ORAL | Status: DC | PRN

## 2014-04-06 MED ORDER — SCOPOLAMINE 1 MG/3DAYS TD PT72
1.0000 | MEDICATED_PATCH | Freq: Once | TRANSDERMAL | Status: AC
  Administered 2014-04-06: 1.5 mg via TRANSDERMAL

## 2014-04-06 MED ORDER — OXYTOCIN 10 UNIT/ML IJ SOLN
INTRAMUSCULAR | Status: AC
  Filled 2014-04-06: qty 4

## 2014-04-06 MED ORDER — NALOXONE HCL 1 MG/ML IJ SOLN
1.0000 ug/kg/h | INTRAVENOUS | Status: DC | PRN
  Filled 2014-04-06: qty 2

## 2014-04-06 MED ORDER — KETOROLAC TROMETHAMINE 30 MG/ML IJ SOLN
30.0000 mg | Freq: Four times a day (QID) | INTRAMUSCULAR | Status: AC | PRN
  Administered 2014-04-06: 30 mg via INTRAVENOUS
  Filled 2014-04-06: qty 1

## 2014-04-06 MED ORDER — DIPHENHYDRAMINE HCL 50 MG/ML IJ SOLN
12.5000 mg | INTRAMUSCULAR | Status: DC | PRN
  Administered 2014-04-06: 12.5 mg via INTRAVENOUS
  Filled 2014-04-06: qty 1

## 2014-04-06 MED ORDER — DIPHENHYDRAMINE HCL 25 MG PO CAPS
25.0000 mg | ORAL_CAPSULE | ORAL | Status: DC | PRN
  Filled 2014-04-06: qty 1

## 2014-04-06 MED ORDER — DIBUCAINE 1 % RE OINT: 1.0000 "application " | TOPICAL_OINTMENT | RECTAL | Status: DC | PRN

## 2014-04-06 MED ORDER — DIPHENHYDRAMINE HCL 50 MG/ML IJ SOLN: 25.0000 mg | INTRAMUSCULAR | Status: DC | PRN

## 2014-04-06 MED ORDER — OXYTOCIN 40 UNITS IN LACTATED RINGERS INFUSION - SIMPLE MED: 62.5000 mL/h | INTRAVENOUS | Status: AC

## 2014-04-06 MED ORDER — METOCLOPRAMIDE HCL 5 MG/ML IJ SOLN
10.0000 mg | Freq: Once | INTRAMUSCULAR | Status: DC | PRN
Start: 2014-04-06 — End: 2014-04-06

## 2014-04-06 MED ORDER — SCOPOLAMINE 1 MG/3DAYS TD PT72: 1.0000 | MEDICATED_PATCH | Freq: Once | TRANSDERMAL | Status: DC

## 2014-04-06 MED ORDER — MEPERIDINE HCL 25 MG/ML IJ SOLN: 6.2500 mg | INTRAMUSCULAR | Status: DC | PRN

## 2014-04-06 MED ORDER — ZOLPIDEM TARTRATE 5 MG PO TABS: 5.0000 mg | ORAL_TABLET | Freq: Every evening | ORAL | Status: DC | PRN

## 2014-04-06 MED ORDER — PRENATAL MULTIVITAMIN CH
1.0000 | ORAL_TABLET | Freq: Every day | ORAL | Status: DC
  Administered 2014-04-06 – 2014-04-08 (×3): 1 via ORAL
  Filled 2014-04-06 (×3): qty 1

## 2014-04-06 MED ORDER — BUPIVACAINE IN DEXTROSE 0.75-8.25 % IT SOLN
INTRATHECAL | Status: DC | PRN
  Administered 2014-04-06: 12 mg via INTRATHECAL

## 2014-04-06 MED ORDER — MORPHINE SULFATE 0.5 MG/ML IJ SOLN
INTRAMUSCULAR | Status: AC
  Filled 2014-04-06: qty 10

## 2014-04-06 MED ORDER — ONDANSETRON HCL 4 MG/2ML IJ SOLN: 4.0000 mg | Freq: Three times a day (TID) | INTRAMUSCULAR | Status: DC | PRN

## 2014-04-06 MED ORDER — SODIUM CHLORIDE 0.9 % IJ SOLN: 3.0000 mL | INTRAMUSCULAR | Status: DC | PRN

## 2014-04-06 MED ORDER — SCOPOLAMINE 1 MG/3DAYS TD PT72
MEDICATED_PATCH | TRANSDERMAL | Status: AC
  Administered 2014-04-06: 1.5 mg via TRANSDERMAL
  Filled 2014-04-06: qty 1

## 2014-04-06 MED ORDER — MENTHOL 3 MG MT LOZG: 1.0000 | LOZENGE | OROMUCOSAL | Status: DC | PRN

## 2014-04-06 MED ORDER — FENTANYL CITRATE 0.05 MG/ML IJ SOLN
INTRAMUSCULAR | Status: DC | PRN
  Administered 2014-04-06: 25 ug via INTRATHECAL

## 2014-04-06 SURGICAL SUPPLY — 47 items
BENZOIN TINCTURE PRP APPL 2/3 (GAUZE/BANDAGES/DRESSINGS) ×3 IMPLANT
CLAMP CORD UMBIL (MISCELLANEOUS) IMPLANT
CLOSURE WOUND 1/2 X4 (GAUZE/BANDAGES/DRESSINGS) ×1
CLOTH BEACON ORANGE TIMEOUT ST (SAFETY) ×3 IMPLANT
DRAPE LG THREE QUARTER DISP (DRAPES) IMPLANT
DRSG OPSITE POSTOP 4X10 (GAUZE/BANDAGES/DRESSINGS) ×3 IMPLANT
DURAPREP 26ML APPLICATOR (WOUND CARE) ×3 IMPLANT
ELECT REM PT RETURN 9FT ADLT (ELECTROSURGICAL) ×3
ELECTRODE REM PT RTRN 9FT ADLT (ELECTROSURGICAL) ×1 IMPLANT
EXTRACTOR VACUUM BELL STYLE (SUCTIONS) ×3 IMPLANT
GLOVE BIO SURGEON STRL SZ 6.5 (GLOVE) ×2 IMPLANT
GLOVE BIO SURGEONS STRL SZ 6.5 (GLOVE) ×1
GLOVE BIOGEL PI IND STRL 7.0 (GLOVE) ×1 IMPLANT
GLOVE BIOGEL PI INDICATOR 7.0 (GLOVE) ×2
GOWN STRL REUS W/TWL LRG LVL3 (GOWN DISPOSABLE) ×9 IMPLANT
HEMOSTAT SURGICEL 4X8 (HEMOSTASIS) ×3 IMPLANT
KIT ABG SYR 3ML LUER SLIP (SYRINGE) IMPLANT
NEEDLE HYPO 25X5/8 SAFETYGLIDE (NEEDLE) IMPLANT
NS IRRIG 1000ML POUR BTL (IV SOLUTION) ×3 IMPLANT
PACK C SECTION WH (CUSTOM PROCEDURE TRAY) ×3 IMPLANT
PAD ABD 8X7 1/2 STERILE (GAUZE/BANDAGES/DRESSINGS) ×3 IMPLANT
PAD OB MATERNITY 4.3X12.25 (PERSONAL CARE ITEMS) ×3 IMPLANT
PENCIL BUTTON HOLSTER BLD 10FT (ELECTRODE) ×3 IMPLANT
RETRACTOR WND ALEXIS 25 LRG (MISCELLANEOUS) ×1 IMPLANT
RTRCTR WOUND ALEXIS 25CM LRG (MISCELLANEOUS) ×3
SPONGE LAP 18X18 X RAY DECT (DISPOSABLE) ×3 IMPLANT
STAPLER VISISTAT 35W (STAPLE) ×3 IMPLANT
STRIP CLOSURE SKIN 1/2X4 (GAUZE/BANDAGES/DRESSINGS) ×2 IMPLANT
SUT CHROMIC 2 0 CT 1 (SUTURE) ×9 IMPLANT
SUT CHROMIC 3 0 SH 27 (SUTURE) ×3 IMPLANT
SUT MNCRL 0 VIOLET CTX 36 (SUTURE) ×2 IMPLANT
SUT MONOCRYL 0 CTX 36 (SUTURE) ×4
SUT PDS AB 0 CTX 36 PDP370T (SUTURE) IMPLANT
SUT PLAIN 2 0 (SUTURE) ×2
SUT PLAIN ABS 2-0 CT1 27XMFL (SUTURE) ×1 IMPLANT
SUT VIC AB 0 CT1 27 (SUTURE)
SUT VIC AB 0 CT1 27XBRD ANBCTR (SUTURE) IMPLANT
SUT VIC AB 2-0 CT1 27 (SUTURE) ×6
SUT VIC AB 2-0 CT1 TAPERPNT 27 (SUTURE) ×3 IMPLANT
SUT VIC AB 2-0 SH 27 (SUTURE) ×2
SUT VIC AB 2-0 SH 27XBRD (SUTURE) ×1 IMPLANT
SUT VIC AB 4-0 KS 27 (SUTURE) ×3 IMPLANT
TOWEL OR 17X24 6PK STRL BLUE (TOWEL DISPOSABLE) ×3 IMPLANT
TRAY FOLEY CATH 14FR (SET/KITS/TRAYS/PACK) ×3 IMPLANT
TUBING SUCTION BULK 100 FT (MISCELLANEOUS) ×3 IMPLANT
WATER STERILE IRR 1000ML POUR (IV SOLUTION) ×3 IMPLANT
YANKAUER SUCT BULB TIP NO VENT (SUCTIONS) ×6 IMPLANT

## 2014-04-06 NOTE — Lactation Note (Signed)
This note was copied from the chart of Patricia Nuala AlphaYvonne Preiss. Lactation Consultation Note DEBP set up by [facility] RN for supplementation due to <6#. LC initial consult needed.  Patient Name: Patricia Holloway [id number]'[id number]'s Date: 04/06/2014     Maternal Data    Feeding    LATCH Score/Interventions                      Lactation Tools Discussed/Used     Consult Status      Shoptaw, Arvella MerlesJana Lynn 04/06/2014, 11:00 PM

## 2014-04-06 NOTE — Progress Notes (Deleted)
Patient ID: Patricia SimmerYvonne F Holloway, female   DOB: [birth year], 33 y.o.   MRN: [id number]  Pt pushing for 1 and 1/2 h ours in ROT position.  Vertex has not moved in station.  There is no 1-2 cm of caput.  Pt not pushing well in spite of coaching at bedside.  Manual rotation  Tried without success.  Pt offered vacuum attempt or primary c/s.  Pt understands risks include but not limited to vaginal laceration, scalp laceration, and deep intracranial bleed.   Pt's bladder empty and Kiwi vacuum applied.  3 pop offs occurred over 4 contractions.  There was some descent but still unable to deliver.  At this point it was decided to proceed with c/s.  The risks of cesarean section discussed with the patient included but were not limited to: bleeding which may require transfusion or reoperation; infection which may require antibiotics; injury to bowel, bladder, ureters or other surrounding organs; injury to the fetus; need for additional procedures including hysterectomy in the event of a life-threatening hemorrhage; placental abnormalities wth subsequent pregnancies, incisional problems, thromboembolic phenomenon and other postoperative/anesthesia complications. The patient concurred with the proposed plan, giving informed written consent for the procedure. This consent was obtained with [facility] interpreter.

## 2014-04-06 NOTE — Anesthesia Postprocedure Evaluation (Signed)
  Anesthesia Post-op Note  Patient: Patricia Holloway  Procedure(s) Performed: Procedure(s): REPEAT CESAREAN SECTION (N/A)  Patient Location: Mother/Baby  Anesthesia Type:Spinal  Level of Consciousness: awake, alert  and oriented  Airway and Oxygen Therapy: Patient Spontanous Breathing  Post-op Pain: none  Post-op Assessment: Post-op Vital signs reviewed, Patient's Cardiovascular Status Stable, Respiratory Function Stable, No headache, No backache, No residual numbness and No residual motor weakness  Post-op Vital Signs: Reviewed and stable  Last Vitals:  Filed Vitals:   04/06/14 1625  BP: 114/76  Pulse: 78  Temp:   Resp:     Complications: No apparent anesthesia complications

## 2014-04-06 NOTE — Brief Op Note (Signed)
04/06/2014  9:21 AM  PATIENT:  Patricia Holloway  33 y.o. female  PRE-OPERATIVE DIAGNOSIS:  REPEAT  POST-OPERATIVE DIAGNOSIS:  REPEAT Cesarean Section  PROCEDURE:  Procedure(s): REPEAT CESAREAN SECTION (N/A)  SURGEON:  Surgeon(s) and Role:    * Essie HartWalda Rolene Andrades, MD - Primary    * Miguel AschoffAllan Ross, MD - Assisting  PHYSICIAN ASSISTANT:   ASSISTANTS: Duane LopeAlan Ross   ANESTHESIA:   epidural and spinal  EBL:  Total I/O In: 3000 [I.V.:3000] Out: 1050 [Urine:250; Blood:800]  BLOOD ADMINISTERED:none  DRAINS: Urinary Catheter (Foley)   LOCAL MEDICATIONS USED:  NONE  SPECIMEN:  No Specimen  DISPOSITION OF SPECIMEN:  N/A  COUNTS:  YES  TOURNIQUET:  * No tourniquets in log *  DICTATION: .Note written in EPIC  PLAN OF CARE: Admit to inpatient   PATIENT DISPOSITION:  PACU - hemodynamically stable.   Delay start of Pharmacological VTE agent (>24hrs) due to surgical blood loss or risk of bleeding: not applicable  Patricia Holloway Patricia Holloway

## 2014-04-06 NOTE — Anesthesia Procedure Notes (Signed)
Spinal  Patient location during procedure: OR Start time: 04/06/2014 7:38 AM Staffing Anesthesiologist: Sherrina Zaugg A. Performed by: anesthesiologist  Preanesthetic Checklist Completed: patient identified, site marked, surgical consent, pre-op evaluation, timeout performed, IV checked, risks and benefits discussed and monitors and equipment checked Spinal Block Patient position: sitting Prep: site prepped and draped and DuraPrep Patient monitoring: heart rate, cardiac monitor, continuous pulse ox and blood pressure Approach: midline Location: L3-4 Injection technique: single-shot Needle Needle type: Sprotte and Tuohy  Needle gauge: 24 G Needle length: 9 cm Needle insertion depth: 9 cm Catheter type: closed end flexible Catheter size: 19 g Catheter at skin depth: 14 cm Assessment Sensory level: T4 Additional Notes Epidural performed with Touhy LOR with air. SAB performed through epidural needle. CSF clear free flow and no paresthesias. Rx injected. Spinal needle withdrawn and Epidural catheter threaded 6cm into epidural space. Sterile dressing applied after epidural catheter removed/

## 2014-04-06 NOTE — Anesthesia Preprocedure Evaluation (Signed)
Anesthesia Evaluation  Patient identified by MRN, date of birth, ID band Patient awake    Reviewed: Allergy & Precautions, H&P , NPO status , Patient's Chart, lab work & pertinent test results, reviewed documented beta blocker date and time   History of Anesthesia Complications (+) PONV, POST - OP SPINAL HEADACHE and history of anesthetic complications (h/o spinal headache after spinal for TAB (no EBP).  h/o PONV after C/S)  Airway Mallampati: I TM Distance: >3 FB Neck ROM: full    Dental  (+) Teeth Intact   Pulmonary Current Smoker (1/3 ppd),  breath sounds clear to auscultation        Cardiovascular negative cardio ROS  Rhythm:regular Rate:Normal     Neuro/Psych negative neurological ROS  negative psych ROS   GI/Hepatic negative GI ROS, Neg liver ROS,   Endo/Other  BMI 37.5  Renal/GU negative Renal ROS  negative genitourinary   Musculoskeletal   Abdominal   Peds  Hematology  (+) Sickle cell trait and anemia ,   Anesthesia Other Findings   Reproductive/Obstetrics (+) Pregnancy (h/o C/Sx4, for Repeat #5)                           Anesthesia Physical Anesthesia Plan  ASA: II  Anesthesia Plan: Combined Spinal and Epidural   Post-op Pain Management:    Induction:   Airway Management Planned:   Additional Equipment:   Intra-op Plan:   Post-operative Plan:   Informed Consent: I have reviewed the patients History and Physical, chart, labs and discussed the procedure including the risks, benefits and alternatives for the proposed anesthesia with the patient or authorized representative who has indicated his/her understanding and acceptance.     Plan Discussed with: Surgeon and CRNA  Anesthesia Plan Comments:         Anesthesia Quick Evaluation

## 2014-04-06 NOTE — Transfer of Care (Signed)
Immediate Anesthesia Transfer of Care Note  Patient: Patricia Holloway  Procedure(s) Performed: Procedure(s): REPEAT CESAREAN SECTION (N/A)  Patient Location: PACU  Anesthesia Type:Spinal and Epidural  Level of Consciousness: awake  Airway & Oxygen Therapy: Patient Spontanous Breathing  Post-op Assessment: Report given to PACU RN and Post -op Vital signs reviewed and stable  Post vital signs: stable  Complications: No apparent anesthesia complications

## 2014-04-06 NOTE — Op Note (Signed)
Cesarean Section Procedure Note  Indications: previous uterine incision kerr x 4   Pre-operative Diagnosis: 38 week 6 day pregnancy, prior LTCS with history four previous cesarean deliveries  Post-operative Diagnosis: same  Surgeon: Caffie Damme   Assistants: Melinda Crutch  Anesthesia: Combined Epidural and Spinal anesthesia  ASA Class: 2  Procedure Details   The patient was seen in the Holding Room. The risks, benefits, complications, treatment options, and expected outcomes were discussed with the patient.  The patient concurred with the proposed plan, giving informed consent.  The site of surgery properly noted/marked. The patient was taken to Operating Room # 1, identified as Dijon Kohlman and the procedure verified as C-Section Delivery. A Time Out was held and the above information confirmed.  After induction of anesthesia, the patient was placed in the dorsal supine position with a leftward tilt, draped and prepped in the usual sterile manner. A Pfannenstiel incision was made and carried down through the subcutaneous tissue to the fascia.  The fascia was incised in the midline and the fascial incision was extended laterally with Mayo scissors. The superior aspect of the fascial incision was grasped with Coker clamps x2, tented up and the rectus muscles dissected off sharply with the scalpel. The rectus was then dissected off with blunt dissection and Mayo scissors inferiorly. The rectus muscles were separated in the midline. There was dense adhesions from the anterior abdominal wall to the anterior uterine wall. Several of these adhesions were taken down by the bovie. The abdominal peritoneum was entered bluntly using surgeons fingers and the incision was extended superiorly and inferiorly using the bovie ensuring that the bladder was not injured or underlying bowel. The bladder blade was introduced. The vesicouterine peritoneum was adherent and the bladder flap could not be made. The  scalpel was then used to make a low transverse incision on the uterus which was extended laterally with bandage scissors. The placenta was noted to be anterior and the fetal vertex was identified, Attempt was made to use the vacuum unsuccessfully therefore the incision was extended and then the vertex was delivered easily through the uterine incision followed by the body. The A live female infant was bulb suctioned on the operative field cried vigorously, cord was clamped and cut and the infant was passed to the waiting neonatologist. Apgars 8/9. Placenta was then delivered spontaneously, intact and appear normal, the uterus was cleared of all clot and debris. The uterus was exteriorized and the uterine incision was repaired with #1 Monocryl in running locked fashion. A second imbricating suture was used of 1- Monocryl. The uterus was returned to the abdominal cavity the abdominal cavity was cleared of all clot and debris. There was oozing throughout the incision line as well as the anterior wall of the uterus which appeared raw from scar tissue and damaged serosa.  Multiple figure of 8 sutures and interrupted sutures using 2-0 vicryl and chromic were used to achieve hemostasis.  Several edges of serosa were also bovied. Hemostasis improved. Surgicel placed over hysterotomy. An en bloc re-approximation of the abdominal peritoneum with the muscle was reapproximated with 2-0 chromic in interrupted fashion. The fascia was closed with 0 PDS in a running fashion from both ends of fascia, Met in the middle. Several interrupted sutures of 2-0 plain was placed in the subcuticular layer. The skin was closed with staples A pressure dressing was placed. All sponge lap and needle counts were correct x2. Patient tolerated the procedure well and recovered in stable condition  following the procedure.  Instrument, sponge, and needle counts were correct prior the abdominal closure and at the conclusion of the case.    Findings: Live female infant, Apgars 8/9, clear fluid,  normal appearing placenta, normal uterus, bilateral tubes and ovaries  Estimated Blood Loss:  832m         Drains: Foley catheter                 Specimens: Placenta          Implants: none         Complications:  None; patient tolerated the procedure well.         Disposition: PACU - hemodynamically stable.  Leiam Hopwood [name]

## 2014-04-06 NOTE — Addendum Note (Signed)
Addendum created 04/06/14 1051 by Cristela BlueKyle Ausar Georgiou, MD   Modules edited: Anesthesia LDA

## 2014-04-06 NOTE — Addendum Note (Signed)
Addendum created 04/06/14 1652 by Shanon PayorSuzanne M Lacreasha Hinds, CRNA   Modules edited: Notes Section   Notes Section:  File: [id number]

## 2014-04-06 NOTE — Anesthesia Postprocedure Evaluation (Signed)
  Anesthesia Post-op Note  Patient: Patricia Holloway  Procedure(s) Performed: Procedure(s): REPEAT CESAREAN SECTION (N/A)  Patient is awake, responsive, moving her legs, and has signs of resolution of her numbness. Pain and nausea are reasonably well controlled. Vital signs are stable and clinically acceptable. Oxygen saturation is clinically acceptable. There are no apparent anesthetic complications at this time. Patient is ready for discharge.

## 2014-04-06 NOTE — H&P (Signed)
Patricia Holloway is a 33 y.o. female presenting for scheduled elective repeat cesarean delivery.  Maternal Medical History:  Reason for admission: Patient for scheduled repeat cesarean delivery x5  Contractions: Frequency: rare.    Fetal activity: Perceived fetal activity is normal.   Last perceived fetal movement was within the past 12 hours.    Prenatal complications: no prenatal complications Prenatal Complications - Diabetes: none.    OB History   Grav Para Term Preterm Abortions TAB SAB Ect Mult Living   8 4 2  3 1  2  4      Past Medical History  Diagnosis Date  . Ectopic pregnancy   . PONV (postoperative nausea and vomiting)   . Spinal headache   . Sickle cell trait    Past Surgical History  Procedure Laterality Date  . Ectopic pregnancy surgery    . Cesarean section    . Unilateral salpingectomy Right 2004   Family History: family history includes Heart disease in her mother; Sickle cell trait in her father. Social History:  reports that she has been smoking.  She has never used smokeless tobacco. She reports that she does not drink alcohol or use illicit drugs.   Prenatal Transfer Tool  Maternal Diabetes: No Genetic Screening: Normal Maternal Ultrasounds/Referrals: Normal Fetal Ultrasounds or other Referrals:  None Maternal Substance Abuse:  No Significant Maternal Medications:  None Significant Maternal Lab Results:  None Other Comments:  None  Review of Systems  Constitutional: Negative for fever.  HENT: Negative for hearing loss.   Eyes: Negative for blurred vision.  Respiratory: Negative for cough.   Cardiovascular: Negative for chest pain.  Gastrointestinal: Negative for heartburn.  Genitourinary: Negative for dysuria.  Musculoskeletal: Negative for myalgias.  Skin: Negative for rash.  Neurological: Negative for headaches.  Endo/Heme/Allergies: Does not bruise/bleed easily.  Psychiatric/Behavioral: Negative for depression.      Blood  pressure 118/57, pulse 65, temperature 97.9 F (36.6 C), temperature source Oral, resp. rate 20, last menstrual period 07/24/2013, SpO2 100.00%. Maternal Exam:  Uterine Assessment: Contraction strength is mild.  Contraction duration is 30 seconds. Contraction frequency is irregular.   Abdomen: Surgical scars: low transverse.   Fundal height is 39.   Estimated fetal weight is 3200 grams.   Fetal presentation: vertex  Introitus: Normal vulva. Ferning test: not done.  Nitrazine test: not done. Amniotic fluid character: not assessed.     Physical Exam  Nursing note and vitals reviewed. Constitutional: She is oriented to person, place, and time. She appears well-developed and well-nourished.  HENT:  Head: Normocephalic.  Eyes: Pupils are equal, round, and reactive to light.  Cardiovascular: Normal rate.   Respiratory: Effort normal and breath sounds normal.  GI: Soft. Bowel sounds are normal.  Musculoskeletal: Normal range of motion.  Neurological: She is alert and oriented to person, place, and time.    Prenatal labs: ABO, Rh: --/--/O POS (07/15 1145) Antibody: NEG (07/15 1145) Rubella:   RPR: NON REAC (07/15 1145)  HBsAg:    HIV:    GBS:     Assessment/Plan: SIUP at term for elective repeat cesarean  Patient will be counseled that she is high risk for bowel / bladder injury, bleeding possible c-hyst    Patricia Holloway Patricia Holloway 04/06/2014, 6:39 AM

## 2014-04-07 LAB — CBC
HEMATOCRIT: 26 % — AB (ref 36.0–46.0)
Hemoglobin: 8.3 g/dL — ABNORMAL LOW (ref 12.0–15.0)
MCH: 28.7 pg (ref 26.0–34.0)
MCHC: 31.9 g/dL (ref 30.0–36.0)
MCV: 90 fL (ref 78.0–100.0)
Platelets: 161 10*3/uL (ref 150–400)
RBC: 2.89 MIL/uL — ABNORMAL LOW (ref 3.87–5.11)
RDW: 15.3 % (ref 11.5–15.5)
WBC: 9.5 10*3/uL (ref 4.0–10.5)

## 2014-04-07 NOTE — Progress Notes (Signed)
POD#1 Pt without complaints. Lochia-wnl Hgb-stable VSSAF IMP/ Doing well PLAN/ Routine care

## 2014-04-07 NOTE — Lactation Note (Signed)
This note was copied from the chart of Girl Nuala AlphaYvonne Hashem. Lactation Consultation Note  Patient Name: Girl Nuala AlphaYvonne Bilbo QMVHQ'IToday's Date: 04/07/2014 Reason for consult: Initial assessment Baby 33 hours of life. Mom reports baby not latching at breast. Mom has been pumping and feeding EBM with bottle. Enc mom to offer breast first. Baby less than 6 pounds, so mom enc to offer breast at least every 3 hours, and more often with cues. Enc mom to offer lots of STS. Mom has previous breastfeeding experience, 1 month the longest with 4 older children. Assisted mom to attempt to latch baby to right breast in football position. Baby had just nursed within the last hour, so would not latch. Mom enc to continue to hold STS and nurse with cues. Mom given information to rent DEBP for 2 weeks while waiting for one to be sent from insurance. Mom given LC booklet, aware of OP/BFSG and community resources. Enc mom to call for assistance as needed.   Maternal Data Has patient been taught Hand Expression?: Yes Does the patient have breastfeeding experience prior to this delivery?: Yes  Feeding Feeding Type: Breast Fed Nipple Type: Slow - flow Length of feed: 0 min  LATCH Score/Interventions Latch: Too sleepy or reluctant, no latch achieved, no sucking elicited. Intervention(s): Skin to skin Intervention(s): Adjust position;Assist with latch;Breast compression  Audible Swallowing: None  Type of Nipple: Everted at rest and after stimulation  Comfort (Breast/Nipple): Soft / non-tender     Hold (Positioning): No assistance needed to correctly position infant at breast.  LATCH Score: 6  Lactation Tools Discussed/Used     Consult Status Consult Status: Follow-up Date: 04/08/14 Follow-up type: In-patient    Geralynn OchsWILLIARD, Terell Kincy 04/07/2014, 5:17 PM

## 2014-04-08 NOTE — Progress Notes (Addendum)
Pt complained of pain in right calf. She has been ambulatory.  Positive Homan's sign. Put SCD's back on and called Dr. Dareen PianoAnderson, physician on call for Dr Mora ApplPinn; telephone order for doppler stat of right calf.  22:50. Doppler won't be done until morning.  Reassessed calf,Negaive Homan's sign at 2235.  measured (R: 17 in; L: 16.75 in).  Patient states pain comes and goes.  No redness or apparent swelling, no heat. Right calf: 17 inches. Left calf, 116.75 in.  Called Dr Dareen PianoAnderson to update; no new orders, keep SCD's on.

## 2014-04-08 NOTE — Progress Notes (Signed)
POD#2 Pt without c/o. Lochia-wnl. Pain control adequate. VSSAF IMP/doing well Plan/ Routine care

## 2014-04-08 NOTE — Lactation Note (Signed)
This note was copied from the chart of Patricia Holloway. Lactation Consultation Note  Patient Name: Patricia Nuala AlphaYvonne Crisco ZHYQM'VToday's Date: 04/08/2014 Reason for consult: Follow-up assessment Baby 62 hours of life. Mom has not pumped in over 5 hours. Enc mom to pump now. Enc mom to pump on standard setting and at the highest suction that is comfortable. Mom states that baby is latching well now. Enc mom to continue to offer breast first and then supplement as needed.   Maternal Data    Feeding Feeding Type:  (Mom states baby fed a little earlier, but she needs to pump.)  LATCH Score/Interventions                      Lactation Tools Discussed/Used     Consult Status Consult Status: Follow-up Date: 04/09/14 Follow-up type: In-patient    Geralynn OchsWILLIARD, Donovyn Guidice 04/08/2014, 10:56 PM

## 2014-04-09 ENCOUNTER — Ambulatory Visit: Payer: Self-pay

## 2014-04-09 ENCOUNTER — Encounter (HOSPITAL_COMMUNITY): Payer: Self-pay | Admitting: Obstetrics & Gynecology

## 2014-04-09 DIAGNOSIS — M79609 Pain in unspecified limb: Secondary | ICD-10-CM

## 2014-04-09 MED ORDER — OXYCODONE-ACETAMINOPHEN 5-325 MG PO TABS: 1.0000 | ORAL_TABLET | ORAL | Status: DC | PRN

## 2014-04-09 NOTE — Lactation Note (Signed)
This note was copied from the chart of Girl Nuala AlphaYvonne Fellows. Lactation Consultation Note  Patient Name: Girl Nuala AlphaYvonne Daigler ZOXWR'UToday's Date: 04/09/2014 Reason for consult: Follow-up assessment (packed up , ready for discharge ) Per mom is breast and bottle feeding , has a hand pump at home , and has called [employer]  Insurance company for a DEBP , which will take 7-10 days for arrival. [facility] , mom declined due to finances. LC suggested if she comes up with the funds to [facility] , also to consider an LC O/P apt. Reviewed engorgement prevention and tx. Dicussed with mom  a 2nd way to help the baby relax tongue for latching , give an appetizer of EBM or formula with a bottle and then  Pull out the bottle and latch.  Mother informed of post-discharge support and given phone number to the lactation department, including services for phone call assistance; out-patient appointments; and breastfeeding support group. List of other breastfeeding resources in the community given in the handout. Encouraged mother to call for problems or concerns related to breastfeeding.Mother informed of post-discharge support and given phone number to the lactation department, including services for phone call assistance; out-patient appointments; and breastfeeding support group. List of other breastfeeding resources in the community given in the handout. Encouraged mother to call for problems or concerns related to breastfeeding.   Maternal Data Formula Feeding for Exclusion: No Infant to breast within first hour of birth: Yes (attempted sleepy ) Does the patient have breastfeeding experience prior to this delivery?: Yes  Feeding Feeding Type: Bottle Fed - Formula  LATCH Score/Interventions                Intervention(s): Breastfeeding basics reviewed     Lactation Tools Discussed/Used Tools:  (per mom has a hand pump at home , and has already [employer]  insurance company for a DEBP , see LC note ) WIC Program: No   Consult Status Consult Status: Complete Date: 04/09/14    Kathrin Greathouseorio, Jequan Shahin Ann 04/09/2014, 11:10 AM

## 2014-04-09 NOTE — Progress Notes (Signed)
*  PRELIMINARY RESULTS* Vascular Ultrasound Right lower extremity venous duplex has been completed.  Preliminary findings: Right:  No evidence of DVT, superficial thrombosis, or Baker's cyst.   Farrel DemarkJill Eunice, RDMS, RVT  04/09/2014, 8:58 AM

## 2014-04-09 NOTE — Progress Notes (Signed)
POD#3 Doing well, no complaints. +flatus, appropriate lochia. Right leg doppler negative, per pt she thought it was just sciatic pain she's had throughout the pregnancy. Desires d/c home.

## 2014-04-09 NOTE — Discharge Summary (Signed)
Obstetric Discharge Summary Reason for Admission: cesarean section Prenatal Procedures: ultrasound Intrapartum Procedures: cesarean: low cervical, transverse, BTL Postpartum Procedures: doppler evaluation of R calf - negative Complications-Operative and Postpartum: none Hemoglobin  Date Value Ref Range Status  04/07/2014 8.3* 12.0 - 15.0 g/dL Final     HCT  Date Value Ref Range Status  04/07/2014 26.0* 36.0 - 46.0 % Final    Physical Exam:  General: alert and cooperative Lochia: appropriate Uterine Fundus: firm Incision: healing well, no significant drainage, no significant erythema DVT Evaluation: No evidence of DVT seen on physical exam.  Discharge Diagnoses: Term Pregnancy-delivered  Discharge Information: Date: 04/09/2014 Activity: pelvic rest Diet: routine Medications: PNV, Ibuprofen and Percocet, iron Condition: stable Instructions: refer to practice specific booklet Discharge to: home Follow-up Information   Follow up with PINN, Sanjuana MaeWALDA [name], MD In 3 days. (For wound re-check, staple removal)    Specialty:  Obstetrics and Gynecology   Contact information:   [address] [place] [id number] [phone]       Newborn Data: Live born female  Birth Weight: 5 lb 14.4 oz (2675 g) APGAR: 8, 9  Home with mother.  Patricia Holloway, Patricia Holloway 04/09/2014, 9:48 AM

## 2014-04-23 ENCOUNTER — Encounter (HOSPITAL_COMMUNITY): Payer: Self-pay | Admitting: *Deleted

## 2014-07-23 ENCOUNTER — Encounter (HOSPITAL_COMMUNITY): Payer: Self-pay | Admitting: *Deleted

## 2014-09-21 DIAGNOSIS — K56600 Partial intestinal obstruction, unspecified as to cause: Secondary | ICD-10-CM

## 2014-09-21 HISTORY — DX: Partial intestinal obstruction, unspecified as to cause: K56.600

## 2014-10-08 ENCOUNTER — Inpatient Hospital Stay (HOSPITAL_COMMUNITY)
Admission: EM | Admit: 2014-10-08 | Discharge: 2014-10-11 | DRG: 390 | Disposition: A | Discharge status: Home / Self Care | Payer: 59 | Attending: Internal Medicine | Admitting: Internal Medicine

## 2014-10-08 ENCOUNTER — Emergency Department (HOSPITAL_COMMUNITY): Payer: 59

## 2014-10-08 ENCOUNTER — Encounter (HOSPITAL_COMMUNITY): Payer: Self-pay | Admitting: Family Medicine

## 2014-10-08 DIAGNOSIS — Z9079 Acquired absence of other genital organ(s): Secondary | ICD-10-CM | POA: Diagnosis present

## 2014-10-08 DIAGNOSIS — D573 Sickle-cell trait: Secondary | ICD-10-CM | POA: Diagnosis present

## 2014-10-08 DIAGNOSIS — D72829 Elevated white blood cell count, unspecified: Secondary | ICD-10-CM | POA: Diagnosis present

## 2014-10-08 DIAGNOSIS — K5669 Other intestinal obstruction: Secondary | ICD-10-CM

## 2014-10-08 DIAGNOSIS — D649 Anemia, unspecified: Secondary | ICD-10-CM | POA: Diagnosis present

## 2014-10-08 DIAGNOSIS — Z9889 Other specified postprocedural states: Secondary | ICD-10-CM

## 2014-10-08 DIAGNOSIS — R630 Anorexia: Secondary | ICD-10-CM | POA: Diagnosis present

## 2014-10-08 DIAGNOSIS — R109 Unspecified abdominal pain: Secondary | ICD-10-CM | POA: Insufficient documentation

## 2014-10-08 DIAGNOSIS — F17210 Nicotine dependence, cigarettes, uncomplicated: Secondary | ICD-10-CM | POA: Diagnosis present

## 2014-10-08 DIAGNOSIS — N858 Other specified noninflammatory disorders of uterus: Secondary | ICD-10-CM | POA: Diagnosis present

## 2014-10-08 DIAGNOSIS — K5900 Constipation, unspecified: Secondary | ICD-10-CM | POA: Diagnosis present

## 2014-10-08 DIAGNOSIS — E162 Hypoglycemia, unspecified: Secondary | ICD-10-CM | POA: Diagnosis not present

## 2014-10-08 DIAGNOSIS — K8080 Other cholelithiasis without obstruction: Secondary | ICD-10-CM | POA: Diagnosis present

## 2014-10-08 DIAGNOSIS — K565 Intestinal adhesions [bands] with obstruction (postprocedural) (postinfection): Secondary | ICD-10-CM | POA: Diagnosis not present

## 2014-10-08 DIAGNOSIS — K56609 Unspecified intestinal obstruction, unspecified as to partial versus complete obstruction: Secondary | ICD-10-CM

## 2014-10-08 DIAGNOSIS — Z91018 Allergy to other foods: Secondary | ICD-10-CM | POA: Diagnosis not present

## 2014-10-08 DIAGNOSIS — K56600 Partial intestinal obstruction, unspecified as to cause: Secondary | ICD-10-CM | POA: Diagnosis present

## 2014-10-08 HISTORY — DX: Postpartum depression: F53.0

## 2014-10-08 HISTORY — DX: Partial intestinal obstruction, unspecified as to cause: K56.600

## 2014-10-08 HISTORY — DX: Other mental disorders complicating the puerperium: O99.345

## 2014-10-08 LAB — URINALYSIS, ROUTINE W REFLEX MICROSCOPIC
Glucose, UA: NEGATIVE mg/dL
Ketones, ur: 15 mg/dL — AB
Nitrite: NEGATIVE
Protein, ur: 100 mg/dL — AB
SPECIFIC GRAVITY, URINE: 1.031 — AB (ref 1.005–1.030)
Urobilinogen, UA: 4 mg/dL — ABNORMAL HIGH (ref 0.0–1.0)
pH: 6 (ref 5.0–8.0)

## 2014-10-08 LAB — COMPREHENSIVE METABOLIC PANEL
ALBUMIN: 3.6 g/dL (ref 3.5–5.2)
ALT: 14 U/L (ref 0–35)
AST: 17 U/L (ref 0–37)
Alkaline Phosphatase: 72 U/L (ref 39–117)
Anion gap: 8 (ref 5–15)
BILIRUBIN TOTAL: 0.9 mg/dL (ref 0.3–1.2)
BUN: 7 mg/dL (ref 6–23)
CHLORIDE: 103 meq/L (ref 96–112)
CO2: 27 mmol/L (ref 19–32)
CREATININE: 0.96 mg/dL (ref 0.50–1.10)
Calcium: 9.2 mg/dL (ref 8.4–10.5)
GFR calc non Af Amer: 77 mL/min — ABNORMAL LOW (ref >90)
GFR, EST AFRICAN AMERICAN: 89 mL/min — AB (ref >90)
GLUCOSE: 103 mg/dL — AB (ref 70–99)
POTASSIUM: 3.7 mmol/L (ref 3.5–5.1)
Sodium: 138 mmol/L (ref 135–145)
Total Protein: 7.6 g/dL (ref 6.0–8.3)

## 2014-10-08 LAB — CBC WITH DIFFERENTIAL/PLATELET
Basophils Absolute: 0 10*3/uL (ref 0.0–0.1)
Basophils Relative: 0 % (ref 0–1)
Eosinophils Absolute: 0 10*3/uL (ref 0.0–0.7)
Eosinophils Relative: 0 % (ref 0–5)
HCT: 34.9 % — ABNORMAL LOW (ref 36.0–46.0)
Hemoglobin: 11.3 g/dL — ABNORMAL LOW (ref 12.0–15.0)
LYMPHS ABS: 1.1 10*3/uL (ref 0.7–4.0)
LYMPHS PCT: 9 % — AB (ref 12–46)
MCH: 28.5 pg (ref 26.0–34.0)
MCHC: 32.4 g/dL (ref 30.0–36.0)
MCV: 87.9 fL (ref 78.0–100.0)
Monocytes Absolute: 0.4 10*3/uL (ref 0.1–1.0)
Monocytes Relative: 3 % (ref 3–12)
NEUTROS ABS: 10.6 10*3/uL — AB (ref 1.7–7.7)
NEUTROS PCT: 88 % — AB (ref 43–77)
PLATELETS: 327 10*3/uL (ref 150–400)
RBC: 3.97 MIL/uL (ref 3.87–5.11)
RDW: 14.6 % (ref 11.5–15.5)
WBC: 12.1 10*3/uL — ABNORMAL HIGH (ref 4.0–10.5)

## 2014-10-08 LAB — URINE MICROSCOPIC-ADD ON

## 2014-10-08 LAB — LIPASE, BLOOD: Lipase: 20 U/L (ref 11–59)

## 2014-10-08 LAB — PREGNANCY, URINE: Preg Test, Ur: NEGATIVE

## 2014-10-08 MED ORDER — SODIUM CHLORIDE 0.9 % IV BOLUS (SEPSIS)
1000.0000 mL | Freq: Once | INTRAVENOUS | Status: AC
Start: 2014-10-08 — End: 2014-10-08
  Administered 2014-10-08: 1000 mL via INTRAVENOUS

## 2014-10-08 MED ORDER — ONDANSETRON HCL 4 MG/2ML IJ SOLN: 4.0000 mg | Freq: Three times a day (TID) | INTRAMUSCULAR | Status: DC | PRN

## 2014-10-08 MED ORDER — SODIUM CHLORIDE 0.9 % IV SOLN
INTRAVENOUS | Status: DC
  Administered 2014-10-08 – 2014-10-10 (×4): via INTRAVENOUS

## 2014-10-08 MED ORDER — HYDROMORPHONE HCL 1 MG/ML IJ SOLN
1.0000 mg | INTRAMUSCULAR | Status: DC | PRN
Start: 2014-10-08 — End: 2014-10-08
  Administered 2014-10-08: 1 mg via INTRAVENOUS
  Filled 2014-10-08: qty 1

## 2014-10-08 MED ORDER — HYDROMORPHONE HCL 1 MG/ML IJ SOLN
1.0000 mg | Freq: Once | INTRAMUSCULAR | Status: AC
  Administered 2014-10-08: 1 mg via INTRAVENOUS
  Filled 2014-10-08: qty 1

## 2014-10-08 MED ORDER — HYDROMORPHONE HCL 1 MG/ML IJ SOLN
1.0000 mg | INTRAMUSCULAR | Status: DC | PRN
  Administered 2014-10-08 – 2014-10-10 (×9): 1 mg via INTRAVENOUS
  Filled 2014-10-08 (×10): qty 1

## 2014-10-08 MED ORDER — IOHEXOL 300 MG/ML  SOLN
100.0000 mL | Freq: Once | INTRAMUSCULAR | Status: AC | PRN
  Administered 2014-10-08: 100 mL via INTRAVENOUS

## 2014-10-08 MED ORDER — ONDANSETRON HCL 4 MG/2ML IJ SOLN
4.0000 mg | Freq: Four times a day (QID) | INTRAMUSCULAR | Status: DC | PRN
  Administered 2014-10-09: 4 mg via INTRAVENOUS
  Filled 2014-10-08: qty 2

## 2014-10-08 MED ORDER — ENOXAPARIN SODIUM 40 MG/0.4ML ~~LOC~~ SOLN
40.0000 mg | SUBCUTANEOUS | Status: DC
  Administered 2014-10-08 – 2014-10-10 (×3): 40 mg via SUBCUTANEOUS
  Filled 2014-10-08 (×4): qty 0.4

## 2014-10-08 MED ORDER — IOHEXOL 300 MG/ML  SOLN
25.0000 mL | Freq: Once | INTRAMUSCULAR | Status: AC | PRN
  Administered 2014-10-08: 25 mL via ORAL

## 2014-10-08 MED ORDER — MORPHINE SULFATE 2 MG/ML IJ SOLN: 2.0000 mg | INTRAMUSCULAR | Status: DC | PRN

## 2014-10-08 MED ORDER — DEXTROSE-NACL 5-0.45 % IV SOLN: INTRAVENOUS | Status: DC

## 2014-10-08 MED ORDER — ONDANSETRON HCL 4 MG PO TABS: 4.0000 mg | ORAL_TABLET | Freq: Four times a day (QID) | ORAL | Status: DC | PRN

## 2014-10-08 NOTE — ED Notes (Signed)
Notified CT that pt has completed contrast. Pt ambulating to bathroom to provide urine sample

## 2014-10-08 NOTE — ED Notes (Signed)
Pt here for abd pain. sts recently treated herself with enema because she is not regular. sts not passing gas and abdomen swollen.

## 2014-10-08 NOTE — ED Provider Notes (Signed)
CSN: [id number]     Arrival date & time 10/08/14  1107 History   First MD Initiated Contact with Patient 10/08/14 1141     Chief Complaint  Patient presents with  . Abdominal Pain     (Consider location/radiation/quality/duration/timing/severity/associated sxs/prior Treatment) HPI Comments: 34 year old female, history of multiple cesarean sections, she has had one of her fallopian tubes removed secondary to ectopic pregnancy, she has not had a cholecystitis or appendicitis. She states that she has frequent constipation, she is having to take enemas fairly frequently, her last enema was Saturday night, she had a following extremely large stool", she has had no further bowel movements and has passed no gas since that time. She has had progressive abdominal distention with pain in her abdomen specifically the right lower quadrant. This is continuing to be worse, the pain fluctuates in intensity, it is not associated with rectal bleeding or dysuria, she has not had any vomiting but has not had anything to eat in 24 hours because of the pain.  Patient is a 34 y.o. female presenting with abdominal pain. The history is provided by the patient.  Abdominal Pain   Past Medical History  Diagnosis Date  . Ectopic pregnancy   . PONV (postoperative nausea and vomiting)   . Spinal headache   . Sickle cell trait    Past Surgical History  Procedure Laterality Date  . Ectopic pregnancy surgery    . Cesarean section    . Unilateral salpingectomy Right 2004  . Cesarean section N/A 04/06/2014    Procedure: REPEAT CESAREAN SECTION;  Surgeon: Essie Hart, MD;  Location: [facility];  Service: Obstetrics;  Laterality: N/A;   Family History  Problem Relation Age of Onset  . Heart disease Mother   . Sickle cell trait Father    History  Substance Use Topics  . Smoking status: Current Some Day Smoker  . Smokeless tobacco: Never Used  . Alcohol Use: No   OB History    Gravida Para Term Preterm AB TAB SAB  Ectopic Multiple Living   Review of Systems  Gastrointestinal: Positive for abdominal pain.  All other systems reviewed and are negative.     Allergies  Pork-derived products  Home Medications   Prior to Admission medications   Medication Sig Start Date End Date Taking? Authorizing Provider  Phenylephrine-Pheniramine-DM [facility] COLD & COUGH PO) Take 10 mLs by mouth 2 (two) times daily as needed (for cold symptoms).   Yes Historical Provider, MD  oxyCODONE-acetaminophen (PERCOCET/ROXICET) 5-325 MG per tablet Take 1-2 tablets by mouth every 4 (four) hours as needed for severe pain (moderate - severe pain). Patient not taking: Reported on 10/08/2014 04/09/14   Philip Aspen, DO   BP 114/68 mmHg  Pulse 86  Temp(Src) 98.2 F (36.8 C) (Oral)  Resp 18  SpO2 100%  LMP 10/08/2014 Physical Exam  Constitutional: She appears well-developed and well-nourished. No distress.  HENT:  Head: Normocephalic and atraumatic.  Mouth/Throat: Oropharynx is clear and moist. No oropharyngeal exudate.  Eyes: Conjunctivae and EOM are normal. Pupils are equal, round, and reactive to light. Right eye exhibits no discharge. Left eye exhibits no discharge. No scleral icterus.  Neck: Normal range of motion. Neck supple. No JVD present. No thyromegaly present.  Cardiovascular: Normal rate, regular rhythm, normal heart sounds and intact distal pulses.  Exam reveals no gallop and no friction rub.   No murmur heard. Pulmonary/Chest: Effort normal  and breath sounds normal. No respiratory distress. She has no wheezes. She has no rales.  Abdominal: Soft. Bowel sounds are normal. She exhibits no distension and no mass. There is tenderness.  The abdomen is obese, very soft, tenderness in the right lower quadrant, tenderness in the mid abdomen, no Murphy sign, no Rovsing sign  Musculoskeletal: Normal range of motion. She exhibits no edema or tenderness.  Lymphadenopathy:    She has no cervical  adenopathy.  Neurological: She is alert. Coordination normal.  Skin: Skin is warm and dry. No rash noted. No erythema.  Psychiatric: She has a normal mood and affect. Her behavior is normal.  Nursing note and vitals reviewed.   ED Course  Procedures (including critical care time) Labs Review Labs Reviewed  CBC WITH DIFFERENTIAL - Abnormal; Notable for the following:    WBC 12.1 (*)    Hemoglobin 11.3 (*)    HCT 34.9 (*)    Neutrophils Relative % 88 (*)    Neutro Abs 10.6 (*)    Lymphocytes Relative 9 (*)    All other components within normal limits  COMPREHENSIVE METABOLIC PANEL - Abnormal; Notable for the following:    Glucose, Bld 103 (*)    GFR calc non Af Amer 77 (*)    GFR calc Af Amer 89 (*)    All other components within normal limits  URINALYSIS, ROUTINE W REFLEX MICROSCOPIC - Abnormal; Notable for the following:    Color, Urine AMBER (*)    APPearance HAZY (*)    Specific Gravity, Urine 1.031 (*)    Hgb urine dipstick LARGE (*)    Bilirubin Urine MODERATE (*)    Ketones, ur 15 (*)    Protein, ur 100 (*)    Urobilinogen, UA 4.0 (*)    Leukocytes, UA SMALL (*)    All other components within normal limits  URINE MICROSCOPIC-ADD ON - Abnormal; Notable for the following:    Squamous Epithelial / LPF MANY (*)    Bacteria, UA MANY (*)    All other components within normal limits  LIPASE, BLOOD  PREGNANCY, URINE    Imaging Review Ct Abdomen Pelvis W Contrast  10/08/2014   CLINICAL DATA:  34 year old female with abdominal pain. Recent self administered enema. Initial encounter.  EXAM: CT ABDOMEN AND PELVIS WITH CONTRAST  TECHNIQUE: Multidetector CT imaging of the abdomen and pelvis was performed using the standard protocol following bolus administration of intravenous contrast.  CONTRAST:  100mL OMNIPAQUE IOHEXOL 300 MG/ML  SOLN  COMPARISON:  None.  FINDINGS: Fluid and gas-filled slightly dilated small bowel loops. Point of obstruction not identified. Change in caliber in  the ileum. This may be related to enteritis or possible partial small bowel obstruction from adhesions. The small amount of fluid in the pelvis may be related to this process as versus recent ovulation.  Redundant slightly rotated colon at the level of the splenic flexure. No primary colonic abnormality noted.  Gallstones largest measuring up to 2.4 cm. No CT evidence of gallbladder inflammation however recommend ultrasound if of clinical concern.  Incidentally noted are are prominent vessels suggesting small varices left splenic renal region and mid anterior abdominal region. No evidence of major vessel thrombosis or findings to suggest cirrhosis.  Liver slightly enlarged spanning over 18.2 cm. No worrisome hepatic, splenic, pancreatic, renal or adrenal lesion.  Lung bases are clear.  No free intraperitoneal air.  No abdominal aortic aneurysm.  No adenopathy.  Decompressed non contrast filled urinary bladder without abnormality noted.  Mild prominence uterine fundus. No worrisome adnexal mass identified.  Degenerative changes L5-S1 with bilateral foraminal narrowing and spinal stenosis greater on the right.  IMPRESSION: Fluid and gas-filled slightly dilated small bowel loops. Point of obstruction not identified. Change in caliber in the ileum. This may be related to enteritis or possible partial small bowel obstruction from adhesions. The small amount of fluid in the pelvis may be related to this process as versus recent ovulation.  Gallstones largest measuring up to 2.4 cm. No CT evidence of gallbladder inflammation however recommend ultrasound if of clinical concern.  Liver slightly enlarged spanning over 18.2 cm.  Please see above   Electronically Signed   By: Bridgett Larsson M.D.   On: 10/08/2014 13:58      MDM   Final diagnoses:  Abdominal pain  Small bowel obstruction    The patient has worsening abdominal pain, she states this is much worse than her usual constipation, with her history of prior  abdominal surgery she will need evaluation for potential bowel obstruction, appendicitis, labs pending to evaluate for pancreatitis and cholecystitis. Pain medication ordered.  Findings discussed with the patient, this includes CT scan evidence of possible early small bowel obstruction, lab work shows leukocytosis, patient continues to be in significant pain. She will be admitted with an NG tube for decompression of the stomach. Discussed with the internal medicine teaching resident, he will admit.  Meds given in ED:  Medications  HYDROmorphone (DILAUDID) injection 1 mg (1 mg Intravenous Given 10/08/14 1203)  sodium chloride 0.9 % bolus 1,000 mL (0 mLs Intravenous Stopped 10/08/14 1330)  iohexol (OMNIPAQUE) 300 MG/ML solution 25 mL (25 mLs Oral Contrast Given 10/08/14 1211)  iohexol (OMNIPAQUE) 300 MG/ML solution 100 mL (100 mLs Intravenous Contrast Given 10/08/14 1314)    New Prescriptions   No medications on file      Vida Roller, MD 10/08/14 1459

## 2014-10-08 NOTE — H&P (Signed)
Date: 10/08/2014               Patient Name:  Patricia Holloway MRN: [id number]  DOB: [birth year] Age / Sex: 34 y.o., female   PCP: No Pcp Per Patient         Medical Service: Internal Medicine Teaching Service         Attending Physician: Dr. Earl Lagos, MD    First Contact: Dr. Senaida Ores Pager: [phone]  Second Contact: Dr. Yetta Barre Pager: [phone]       After Hours (After 5p/  First Contact Pager: [phone]  weekends / holidays): Second Contact Pager: [phone]   Chief Complaint: abdominal pain  History of Present Illness: Patricia Holloway is a 34 yo female with PMHx of ectopic pregnancy s/p removal of fallopian tube and s/p C-sections who presents to the ED with abdominal pain. Patient states she has a history of constipation ever since her first C-section in 2005. Since then, she has had 4 additional C-sections with her last one in July 2015. Patient also had a salpingectomy in 2004 from an ectopic pregnancy. She normal takes laxatives or uses enemas to relieve her constipation. She normally goes every 4 days, but must use a bowel regimen. Patient used an enema 2 days ago and had a resultant large, non-bloody bowel movement. Patient awoke the next morning feeling distended, bloating, and with sharp right sided abdominal pain 7/10 radiating medially. Patient tried to relieve the gas on her own, but her pain worsened and patient decided to present to the ED. She has loss of appetite and has not eaten in 24 hours. She denies fever, chest pain, shortness of breath, nausea, vomiting or diarrhea. She has not passed gas or stool in almost 2 days.   Meds: Current Facility-Administered Medications  Medication Dose Route Frequency Provider Last Rate Last Dose  . 0.9 %  sodium chloride infusion   Intravenous Continuous Patricia Paris, MD 125 mL/hr at 10/08/14 1653    . enoxaparin (LOVENOX) injection 40 mg  40 mg Subcutaneous Q24H Patricia Paris, MD      . HYDROmorphone (DILAUDID) injection 1 mg  1 mg  Intravenous Q3H PRN Patricia Paris, MD      . ondansetron [facility]) tablet 4 mg  4 mg Oral Q6H PRN Patricia Paris, MD       Or  . ondansetron [facility]) injection 4 mg  4 mg Intravenous Q6H PRN Patricia Paris, MD        Allergies: Allergies as of 10/08/2014 - Review Complete 10/08/2014  Allergen Reaction Noted  . Pork-derived products Other (See Comments) 10/08/2014   Past Medical History  Diagnosis Date  . Ectopic pregnancy   . PONV (postoperative nausea and vomiting)   . Spinal headache   . Sickle cell trait    Past Surgical History  Procedure Laterality Date  . Ectopic pregnancy surgery    . Cesarean section    . Unilateral salpingectomy Right 2004  . Cesarean section N/A 04/06/2014    Procedure: REPEAT CESAREAN SECTION;  Surgeon: Essie Hart, MD;  Location: [facility];  Service: Obstetrics;  Laterality: N/A;   Family History  Problem Relation Age of Onset  . Heart disease Mother   . Sickle cell trait Father    History   Social History  . Marital Status: Legally Separated    Spouse Name: N/A    Number of Children: N/A  . Years of Education: N/A   Occupational History  . Not  on file.   Social History Main Topics  . Smoking status: Current Some Day Smoker  . Smokeless tobacco: Never Used  . Alcohol Use: No  . Drug Use: No  . Sexual Activity: Yes    Birth Control/ Protection: None   Other Topics Concern  . Not on file   Social History Narrative    Review of Systems: General: Admits to diaphoresis and loss of appetite. Denies fever, chills, fatigue. Respiratory: Denies SOB, cough Cardiovascular: Denies chest pain and palpitations.  Gastrointestinal: Admits to abdominal pain, distention and constipation. Denies nausea, vomiting, diarrhea, blood in stool  Genitourinary: Denies dysuria, urgency, frequency Skin: Denies pallor, rash and wounds.  Neurological: Admits to dizziness, lightheadedness  Physical Exam: Filed Vitals:   10/08/14 1430 10/08/14 1515 10/08/14 1600  10/08/14 1632  BP: 114/68 125/51 116/58 125/75  Pulse: 86 82 91 91  Temp:    98.4 F (36.9 C)  TempSrc:    Oral  Resp:  16  18  Height:     (1.626 m)  Weight:    96.253 kg (212 lb 3.2 oz)  SpO2: 100% 100% 98% 100%   General: Vital signs reviewed.  Patient is well-developed and well-nourished, in mild acute distress and cooperative with exam.  Cardiovascular: RRR, S1 normal, S2 normal, no murmurs, gallops, or rubs. Pulmonary/Chest: Clear to auscultation bilaterally, no wheezes, rales, or rhonchi. Abdominal: Distended, tender to palpation in RLQ, hypoactive bowel sounds, no masses, organomegaly, or guarding present.  Extremities: No lower extremity edema bilaterally Skin: Warm, dry and intact. No rashes or erythema. Psychiatric: Normal mood and affect. speech and behavior is normal. Cognition and memory are normal.   Lab results: Basic Metabolic Panel:  Recent Labs  40/98/11 1128  NA 138  K 3.7  CL 103  CO2 27  GLUCOSE 103*  BUN 7  CREATININE 0.96  CALCIUM 9.2   Liver Function Tests:  Recent Labs  10/08/14 1128  AST 17  ALT 14  ALKPHOS 72  BILITOT 0.9  PROT 7.6  ALBUMIN 3.6    Recent Labs  10/08/14 1128  LIPASE 20   CBC:  Recent Labs  10/08/14 1128  WBC 12.1*  NEUTROABS 10.6*  HGB 11.3*  HCT 34.9*  MCV 87.9  PLT 327   Urine Drug Screen: Drugs of Abuse     Component Value Date/Time   LABOPIA POSITIVE* 12/06/2013 0912   COCAINSCRNUR NONE DETECTED 12/06/2013 0912   LABBENZ NONE DETECTED 12/06/2013 0912   AMPHETMU NONE DETECTED 12/06/2013 0912   THCU POSITIVE* 12/06/2013 0912   LABBARB NONE DETECTED 12/06/2013 0912    Urinalysis:  Recent Labs  10/08/14 1240  COLORURINE AMBER*  LABSPEC 1.031*  PHURINE 6.0  GLUCOSEU NEGATIVE  HGBUR LARGE*  BILIRUBINUR MODERATE*  KETONESUR 15*  PROTEINUR 100*  UROBILINOGEN 4.0*  NITRITE NEGATIVE  LEUKOCYTESUR SMALL*    Imaging results:  Ct Abdomen Pelvis W Contrast  10/08/2014   CLINICAL DATA:   34 year old female with abdominal pain. Recent self administered enema. Initial encounter.  EXAM: CT ABDOMEN AND PELVIS WITH CONTRAST  TECHNIQUE: Multidetector CT imaging of the abdomen and pelvis was performed using the standard protocol following bolus administration of intravenous contrast.  CONTRAST:  OMNIPAQUE IOHEXOL 300 MG/ML  SOLN  COMPARISON:  None.  FINDINGS: Fluid and gas-filled slightly dilated small bowel loops. Point of obstruction not identified. Change in caliber in the ileum. This may be related to enteritis or possible partial small bowel obstruction from adhesions. The small amount of  fluid in the pelvis may be related to this process as versus recent ovulation.  Redundant slightly rotated colon at the level of the splenic flexure. No primary colonic abnormality noted.  Gallstones largest measuring up to 2.4 cm. No CT evidence of gallbladder inflammation however recommend ultrasound if of clinical concern.  Incidentally noted are are prominent vessels suggesting small varices left splenic renal region and mid anterior abdominal region. No evidence of major vessel thrombosis or findings to suggest cirrhosis.  Liver slightly enlarged spanning over 18.2 cm. No worrisome hepatic, splenic, pancreatic, renal or adrenal lesion.  Lung bases are clear.  No free intraperitoneal air.  No abdominal aortic aneurysm.  No adenopathy.  Decompressed non contrast filled urinary bladder without abnormality noted. Mild prominence uterine fundus. No worrisome adnexal mass identified.  Degenerative changes L5-S1 with bilateral foraminal narrowing and spinal stenosis greater on the right.  IMPRESSION: Fluid and gas-filled slightly dilated small bowel loops. Point of obstruction not identified. Change in caliber in the ileum. This may be related to enteritis or possible partial small bowel obstruction from adhesions. The small amount of fluid in the pelvis may be related to this process as versus recent ovulation.   Gallstones largest measuring up to 2.4 cm. No CT evidence of gallbladder inflammation however recommend ultrasound if of clinical concern.  Liver slightly enlarged spanning over 18.2 cm.  Please see above   Electronically Signed   By: Bridgett LarssonSteve  Olson M.D.   On: 10/08/2014 13:58    Assessment & Plan by Problem: Active Problems:   Partial small bowel obstruction  Partial SBO: Patient presents with abdominal pain, abdominal distention and loss of appetite for the last 2 days after using an enema 2 days ago. On admission, patient was afebrile, normotensive. Labs showed a moderate leukocytosis of 12.1, negative pregnancy test, urinalysis showed amber urine with many squamous cell, many bacteria, negative nitrites and small leukocytes and large hemoglobin. CT abdomen/pelvis showed fluid and gas-filled slightly dilated small bowel loops, but point of obstruction was not identified. There was a change in caliber in the ileum which is likely secondary to partial small bowel obstruction from adhesions.Also incidental gallstones largest measuring up to 2.4 cm, but without gallbladder inflammation. Patient likely has an SBO given her symptoms, CT findings, and surgical history of 5 C-sections and 1 abdominal surgery. Patient is appropriate for non-surgical management and observation for 12-24 hours as an initial trial. Patients without regain of bowel function in 5 days should undergo an operation. If SBO does not resolve within 48 hours, we will consider performing CT with contrast.  -Diluadid 1 mg IV Q3H prn  -Zofran 4 mg Q6H prn -NS 125 mL/hr continuous -CBC/BMET tomorrow am -Serial monitoring -NG Tube -NPO -I/Os  DVT/PE ppx: Lovenox SQ daily  Dispo: Disposition is deferred at this time, awaiting improvement of current medical problems. Anticipated discharge in approximately 1-2 day(s).   The patient does not have a current PCP (No Pcp Per Patient) and [facility]  [facility]charge.  The patient does not have transportation limitations that hinder transportation to clinic appointments.  Signed: Jill AlexandersAlexa Richardson, DO PGY-1 Internal Medicine Resident Pager # [phone] 10/08/2014 5:13 PM

## 2014-10-08 NOTE — ED Notes (Signed)
Admitting MD at bedside.

## 2014-10-09 ENCOUNTER — Inpatient Hospital Stay (HOSPITAL_COMMUNITY): Payer: 59

## 2014-10-09 DIAGNOSIS — K8020 Calculus of gallbladder without cholecystitis without obstruction: Secondary | ICD-10-CM

## 2014-10-09 LAB — LACTIC ACID, PLASMA: Lactic Acid, Venous: 0.7 mmol/L (ref 0.5–2.2)

## 2014-10-09 LAB — CBC
HCT: 33.2 % — ABNORMAL LOW (ref 36.0–46.0)
Hemoglobin: 10.7 g/dL — ABNORMAL LOW (ref 12.0–15.0)
MCH: 29.1 pg (ref 26.0–34.0)
MCHC: 32.2 g/dL (ref 30.0–36.0)
MCV: 90.2 fL (ref 78.0–100.0)
Platelets: 315 10*3/uL (ref 150–400)
RBC: 3.68 MIL/uL — ABNORMAL LOW (ref 3.87–5.11)
RDW: 14.9 % (ref 11.5–15.5)
WBC: 6.4 10*3/uL (ref 4.0–10.5)

## 2014-10-09 LAB — RAPID URINE DRUG SCREEN, HOSP PERFORMED
Amphetamines: NOT DETECTED
BARBITURATES: NOT DETECTED
Benzodiazepines: NOT DETECTED
Cocaine: NOT DETECTED
Opiates: POSITIVE — AB
Tetrahydrocannabinol: POSITIVE — AB

## 2014-10-09 LAB — HEMOGLOBIN A1C
Hgb A1c MFr Bld: 4.8 % (ref <5.7)
Mean Plasma Glucose: 91 mg/dL (ref <117)

## 2014-10-09 LAB — BASIC METABOLIC PANEL
Anion gap: 14 (ref 5–15)
BUN: 9 mg/dL (ref 6–23)
CALCIUM: 8.6 mg/dL (ref 8.4–10.5)
CO2: 19 mmol/L (ref 19–32)
CREATININE: 0.8 mg/dL (ref 0.50–1.10)
Chloride: 107 mEq/L (ref 96–112)
GFR calc Af Amer: >90 mL/min (ref >90)
GFR calc non Af Amer: >90 mL/min (ref >90)
Glucose, Bld: 87 mg/dL (ref 70–99)
POTASSIUM: 4 mmol/L (ref 3.5–5.1)
Sodium: 140 mmol/L (ref 135–145)

## 2014-10-09 LAB — PROCALCITONIN: Procalcitonin: 0.13 ng/mL

## 2014-10-09 LAB — MAGNESIUM: MAGNESIUM: 2.1 mg/dL (ref 1.5–2.5)

## 2014-10-09 MED ORDER — CETYLPYRIDINIUM CHLORIDE 0.05 % MT LIQD: 7.0000 mL | Freq: Two times a day (BID) | OROMUCOSAL | Status: DC

## 2014-10-09 MED ORDER — CHLORHEXIDINE GLUCONATE 0.12 % MT SOLN
15.0000 mL | Freq: Two times a day (BID) | OROMUCOSAL | Status: DC
  Administered 2014-10-09 – 2014-10-11 (×4): 15 mL via OROMUCOSAL
  Filled 2014-10-09 (×4): qty 15

## 2014-10-09 MED ORDER — KETOROLAC TROMETHAMINE 30 MG/ML IJ SOLN
30.0000 mg | Freq: Four times a day (QID) | INTRAMUSCULAR | Status: DC | PRN
  Administered 2014-10-10 (×2): 30 mg via INTRAVENOUS
  Filled 2014-10-09 (×2): qty 1

## 2014-10-09 NOTE — Progress Notes (Signed)
Pt c/o about ng making nauseated and throat pain/ gave pt cepacol lozenges and few ice chips/ pt crying about the discomfort and wanted nurse to pull ng out.  I explained the benefits and consequences of removal too early. I explained I didn't have a Dr order to pull NG out.  Pt pulled the tube out herself states that she feels much better.

## 2014-10-09 NOTE — Progress Notes (Signed)
Subjective:  Patient was seen and examined this morning. No passage of flatus or stool. Patient pulled out NGT 2/2 no discomfort, nausea and vomiting. N/V resolved after removal. Pain unchanged from yesterday. No fever or chills.   Objective: Vital signs in last 24 hours: Filed Vitals:   10/08/14 2152 10/09/14 0558 10/09/14 1350 10/09/14 1500  BP: 132/79 125/83 84/54 130/74  Pulse: 104 98 79   Temp: 99.6 F (37.6 C) 98.4 F (36.9 C) 98.6 F (37 C)   TempSrc: Oral Oral Oral   Resp: 17  16   Height:      Weight:  96.707 kg (213 lb 3.2 oz)    SpO2: 100% 98% 100%    Weight change:   Intake/Output Summary (Last 24 hours) at 10/09/14 1622 Last data filed at 10/09/14 1300  Gross per 24 hour  Intake 1414.58 ml  Output    800 ml  Net 614.58 ml   General: Vital signs reviewed. Patient is well-developed and well-nourished, in no acute distress and cooperative with exam.  Cardiovascular: RRR, S1 normal, S2 normal, no murmurs, gallops, or rubs. Pulmonary/Chest: Clear to auscultation bilaterally, no wheezes, rales, or rhonchi. Abdominal: Distended, tender to palpation in Right Mid-Quadrant, hypoactive bowel sounds, no guarding present.  Extremities: No lower extremity edema bilaterally Skin: Warm, dry and intact. No rashes or erythema. Psychiatric: Normal mood and affect. speech and behavior is normal. Cognition and memory are normal.   Lab Results: Basic Metabolic Panel:  Recent Labs Lab 10/08/14 1128 10/09/14 0433 10/09/14 0758  NA 138 140  --   K 3.7 4.0  --   CL 103 107  --   CO2 27 19  --   GLUCOSE 103* 87  --   BUN 7 9  --   CREATININE 0.96 0.80  --   CALCIUM 9.2 8.6  --   MG  --   --  2.1   Liver Function Tests:  Recent Labs Lab 10/08/14 1128  AST 17  ALT 14  ALKPHOS 72  BILITOT 0.9  PROT 7.6  ALBUMIN 3.6    Recent Labs Lab 10/08/14 1128  LIPASE 20   CBC:  Recent Labs Lab 10/08/14 1128 10/09/14 0433  WBC 12.1* 6.4  NEUTROABS 10.6*  --     HGB 11.3* 10.7*  HCT 34.9* 33.2*  MCV 87.9 90.2  PLT 327 315   Hemoglobin A1C:  Recent Labs Lab 10/09/14 0433  HGBA1C 4.8   Urine Drug Screen: Drugs of Abuse     Component Value Date/Time   LABOPIA POSITIVE* 12/06/2013 0912   COCAINSCRNUR NONE DETECTED 12/06/2013 0912   LABBENZ NONE DETECTED 12/06/2013 0912   AMPHETMU NONE DETECTED 12/06/2013 0912   THCU POSITIVE* 12/06/2013 0912   LABBARB NONE DETECTED 12/06/2013 0912    Urinalysis:  Recent Labs Lab 10/08/14 1240  COLORURINE AMBER*  LABSPEC 1.031*  PHURINE 6.0  GLUCOSEU NEGATIVE  HGBUR LARGE*  BILIRUBINUR MODERATE*  KETONESUR 15*  PROTEINUR 100*  UROBILINOGEN 4.0*  NITRITE NEGATIVE  LEUKOCYTESUR SMALL*   Studies/Results: Dg Abd 1 View  10/09/2014   CLINICAL DATA:  Small bowel obstruction.  EXAM: ABDOMEN - 1 VIEW  COMPARISON:  CT 10/08/2014  FINDINGS: Air contrast are present within the right colon and transverse colon. Paucity of gas within the left colon and rectosigmoid colon. Persistent air-filled mildly dilated small bowel loops in the left upper quadrant. There are minimal degenerative changes of the spine, sacroiliac joints and hips.  IMPRESSION: A few air-filled mildly  dilated small bowel loops in left upper quadrant. Findings may be due to early/ partial small bowel obstruction.   Electronically Signed   By: Elberta Fortis M.D.   On: 10/09/2014 12:54   Ct Abdomen Pelvis W Contrast  10/08/2014   CLINICAL DATA:  34 year old female with abdominal pain. Recent self administered enema. Initial encounter.  EXAM: CT ABDOMEN AND PELVIS WITH CONTRAST  TECHNIQUE: Multidetector CT imaging of the abdomen and pelvis was performed using the standard protocol following bolus administration of intravenous contrast.  CONTRAST:  OMNIPAQUE IOHEXOL 300 MG/ML  SOLN  COMPARISON:  None.  FINDINGS: Fluid and gas-filled slightly dilated small bowel loops. Point of obstruction not identified. Change in caliber in the ileum. This  may be related to enteritis or possible partial small bowel obstruction from adhesions. The small amount of fluid in the pelvis may be related to this process as versus recent ovulation.  Redundant slightly rotated colon at the level of the splenic flexure. No primary colonic abnormality noted.  Gallstones largest measuring up to 2.4 cm. No CT evidence of gallbladder inflammation however recommend ultrasound if of clinical concern.  Incidentally noted are are prominent vessels suggesting small varices left splenic renal region and mid anterior abdominal region. No evidence of major vessel thrombosis or findings to suggest cirrhosis.  Liver slightly enlarged spanning over 18.2 cm. No worrisome hepatic, splenic, pancreatic, renal or adrenal lesion.  Lung bases are clear.  No free intraperitoneal air.  No abdominal aortic aneurysm.  No adenopathy.  Decompressed non contrast filled urinary bladder without abnormality noted. Mild prominence uterine fundus. No worrisome adnexal mass identified.  Degenerative changes L5-S1 with bilateral foraminal narrowing and spinal stenosis greater on the right.  IMPRESSION: Fluid and gas-filled slightly dilated small bowel loops. Point of obstruction not identified. Change in caliber in the ileum. This may be related to enteritis or possible partial small bowel obstruction from adhesions. The small amount of fluid in the pelvis may be related to this process as versus recent ovulation.  Gallstones largest measuring up to 2.4 cm. No CT evidence of gallbladder inflammation however recommend ultrasound if of clinical concern.  Liver slightly enlarged spanning over 18.2 cm.  Please see above   Electronically Signed   By: Bridgett Larsson M.D.   On: 10/08/2014 13:58   Medications:  I have reviewed the patient's current medications. Prior to Admission:  Prescriptions prior to admission  Medication Sig Dispense Refill Last Dose  . Phenylephrine-Pheniramine-DM (THERAFLU COLD & COUGH PO)  Take 10 mLs by mouth 2 (two) times daily as needed (for cold symptoms).   10/07/2014 at Unknown time  . oxyCODONE-acetaminophen (PERCOCET/ROXICET) 5-325 MG per tablet Take 1-2 tablets by mouth every 4 (four) hours as needed for severe pain (moderate - severe pain). (Patient not taking: Reported on 10/08/2014) 30 tablet 0    Scheduled Meds: . enoxaparin (LOVENOX) injection  40 mg Subcutaneous Q24H   Continuous Infusions: . sodium chloride 125 mL/hr at 10/09/14 0840   PRN Meds:.HYDROmorphone (DILAUDID) injection, ketorolac, ondansetron **OR** ondansetron (ZOFRAN) IV Assessment/Plan: Active Problems:   Partial small bowel obstruction   Abdominal pain  Partial SBO 2/2 : CT abdomen/pelvis showed fluid and gas-filled slightly dilated small bowel loops, but point of obstruction was not identified. Patient pulled out NG tube overnight. Her pain is the same and has not worsened. However, she has not had any passage of flatus or stool. Patient was managed with IVF, pain control and bowel rest overnight without improvement. Vital  signs and labs have remained stable; however, bicarb did decrease from 27>19.  Lactic acid 0.7 and Procalcitonin 0.13. Abdominal Xray showed a few air-filled mildly dilated small bowel loops in left upper quadrant. Patient has had no signs of fever, leukocytosis has resolved, pain stable, and no evidence of free air on CT; therefore, there is less concern for ischemia. She has not had indications for emergent surgical intervention- no complete SBO, no closed-loop bowel, no bowel ischemia, necrosis or perforation. However, given lack of improvement and absence of bowel sounds and flatus/stool in over 48 hours, we consulted surgery. Surgery recommended keeping patient NPO, repeating abdominal xray in am, and considering replacing NGT and/or possible surgery if patient worsens or fails to improve.  -Diluadid 1 mg IV Q3H prn  -Zofran 4 mg Q6H prn -NS 125 mL/hr continuous -CBC/BMET  tomorrow am -Serial monitoring -NG Tube -NPO -I/Os -Repeat abdominal xray tomorrow morning  Cholelithiasis: CT showed gallstones largest measuring up to 2.4 cm. No CT evidence of gallbladder inflammation. Patient is asymptomatic from this.  -Stable  DVT/PE ppx: Lovenox SQ daily  Dispo: Disposition is deferred at this time, awaiting improvement of current medical problems.  Anticipated discharge in approximately 1-2 day(s).   The patient does not have a current PCP (No Pcp Per Patient) and does need an [facility] hospital follow-up appointment after discharge.  The patient does not have transportation limitations that hinder transportation to clinic appointments.  .Services Needed at time of discharge: Y = Yes, Blank = No PT:   OT:   RN:   Equipment:   Other:     LOS: 1 day   Jill Alexanders, DO PGY-1 Internal Medicine Resident Pager # [phone] 10/09/2014 4:22 PM

## 2014-10-09 NOTE — Progress Notes (Signed)
Subjective: Patient continues to have right lower quadrant pain, however, she is up and walking around. She has not passed flatus or had a bowel movement and she pulled her NG tube out overnight.  Objective: Vital signs in last 24 hours: Filed Vitals:   10/08/14 1600 10/08/14 1632 10/08/14 2152 10/09/14 0558  BP: 116/58 125/75 132/79 125/83  Pulse: 91 91 104 98  Temp:  98.4 F (36.9 C) 99.6 F (37.6 C) 98.4 F (36.9 C)  TempSrc:  Oral Oral Oral  Resp:  Height:   (1.626 m)    Weight:  96.253 kg (212 lb 3.2 oz)  96.707 kg (213 lb 3.2 oz)  SpO2: 98% 100% 100% 98%   Weight change:   Intake/Output Summary (Last 24 hours) at 10/09/14 1504 Last data filed at 10/09/14 0700  Gross per 24 hour  Intake 1414.58 ml  Output    800 ml  Net 614.58 ml   General: Vital signs reviewed. Patient is well-developed and well-nourished, in mild acute distress and cooperative with exam.  Cardiovascular: RRR, no murmurs, gallops, or rubs. Pulmonary/Chest: Clear to auscultation bilaterally, no wheezes, rales, or rhonchi. Abdominal: Distended, RLQ tenderness, hypoactive bowel sounds, no masses, organomegaly, or guarding present.  Extremities: No lower extremity edema bilaterally Skin: Warm, dry and intact. No rashes or erythema. Psychiatric: Normal mood and affect. speech and behavior is normal. Cognition and memory are normal.   Lab Results: Basic Metabolic Panel:  Recent Labs Lab 10/08/14 1128 10/09/14 0433 10/09/14 0758  NA 138 140  --   K 3.7 4.0  --   CL 103 107  --   CO2 27 19  --   GLUCOSE 103* 87  --   BUN 7 9  --   CREATININE 0.96 0.80  --   CALCIUM 9.2 8.6  --   MG  --   --  2.1   Liver Function Tests:  Recent Labs Lab 10/08/14 1128  AST 17  ALT 14  ALKPHOS 72  BILITOT 0.9  PROT 7.6  ALBUMIN 3.6    Recent Labs Lab 10/08/14 1128  LIPASE 20   No results for input(s): AMMONIA in the last 168 hours. CBC:  Recent Labs Lab 10/08/14 1128  10/09/14 0433  WBC 12.1* 6.4  NEUTROABS 10.6*  --   HGB 11.3* 10.7*  HCT 34.9* 33.2*  MCV 87.9 90.2  PLT 327 315   Cardiac Enzymes: No results for input(s): CKTOTAL, CKMB, CKMBINDEX, TROPONINI in the last 168 hours. BNP: No results for input(s): PROBNP in the last 168 hours. D-Dimer: No results for input(s): DDIMER in the last 168 hours. CBG: No results for input(s): GLUCAP in the last 168 hours. Hemoglobin A1C:  Recent Labs Lab 10/09/14 0433  HGBA1C 4.8   Fasting Lipid Panel: No results for input(s): CHOL, HDL, LDLCALC, TRIG, CHOLHDL, LDLDIRECT in the last 168 hours. Thyroid Function Tests: No results for input(s): TSH, T4TOTAL, FREET4, T3FREE, THYROIDAB in the last 168 hours. Coagulation: No results for input(s): LABPROT, INR in the last 168 hours. Anemia Panel: No results for input(s): VITAMINB12, FOLATE, FERRITIN, TIBC, IRON, RETICCTPCT in the last 168 hours. Urine Drug Screen: Drugs of Abuse     Component Value Date/Time   LABOPIA POSITIVE* 12/06/2013 0912   COCAINSCRNUR NONE DETECTED 12/06/2013 0912   LABBENZ NONE DETECTED 12/06/2013 0912   AMPHETMU NONE DETECTED 12/06/2013 0912   THCU POSITIVE* 12/06/2013 0912   LABBARB NONE DETECTED 12/06/2013 0912    Alcohol Level:  No results for input(s): ETH in the last 168 hours. Urinalysis:  Recent Labs Lab 10/08/14 1240  COLORURINE AMBER*  LABSPEC 1.031*  PHURINE 6.0  GLUCOSEU NEGATIVE  HGBUR LARGE*  BILIRUBINUR MODERATE*  KETONESUR 15*  PROTEINUR 100*  UROBILINOGEN 4.0*  NITRITE NEGATIVE  LEUKOCYTESUR SMALL*     Micro Results: No results found for this or any previous visit (from the past 240 hour(s)). Studies/Results: Dg Abd 1 View  10/09/2014   CLINICAL DATA:  Small bowel obstruction.  EXAM: ABDOMEN - 1 VIEW  COMPARISON:  CT 10/08/2014  FINDINGS: Air contrast are present within the right colon and transverse colon. Paucity of gas within the left colon and rectosigmoid colon. Persistent air-filled  mildly dilated small bowel loops in the left upper quadrant. There are minimal degenerative changes of the spine, sacroiliac joints and hips.  IMPRESSION: A few air-filled mildly dilated small bowel loops in left upper quadrant. Findings may be due to early/ partial small bowel obstruction.   Electronically Signed   By: Elberta Fortisaniel  Boyle M.D.   On: 10/09/2014 12:54   Ct Abdomen Pelvis W Contrast  10/08/2014   CLINICAL DATA:  34 year old female with abdominal pain. Recent self administered enema. Initial encounter.  EXAM: CT ABDOMEN AND PELVIS WITH CONTRAST  TECHNIQUE: Multidetector CT imaging of the abdomen and pelvis was performed using the standard protocol following bolus administration of intravenous contrast.  CONTRAST:  100mL OMNIPAQUE IOHEXOL 300 MG/ML  SOLN  COMPARISON:  None.  FINDINGS: Fluid and gas-filled slightly dilated small bowel loops. Point of obstruction not identified. Change in caliber in the ileum. This may be related to enteritis or possible partial small bowel obstruction from adhesions. The small amount of fluid in the pelvis may be related to this process as versus recent ovulation.  Redundant slightly rotated colon at the level of the splenic flexure. No primary colonic abnormality noted.  Gallstones largest measuring up to 2.4 cm. No CT evidence of gallbladder inflammation however recommend ultrasound if of clinical concern.  Incidentally noted are are prominent vessels suggesting small varices left splenic renal region and mid anterior abdominal region. No evidence of major vessel thrombosis or findings to suggest cirrhosis.  Liver slightly enlarged spanning over 18.2 cm. No worrisome hepatic, splenic, pancreatic, renal or adrenal lesion.  Lung bases are clear.  No free intraperitoneal air.  No abdominal aortic aneurysm.  No adenopathy.  Decompressed non contrast filled urinary bladder without abnormality noted. Mild prominence uterine fundus. No worrisome adnexal mass identified.   Degenerative changes L5-S1 with bilateral foraminal narrowing and spinal stenosis greater on the right.  IMPRESSION: Fluid and gas-filled slightly dilated small bowel loops. Point of obstruction not identified. Change in caliber in the ileum. This may be related to enteritis or possible partial small bowel obstruction from adhesions. The small amount of fluid in the pelvis may be related to this process as versus recent ovulation.  Gallstones largest measuring up to 2.4 cm. No CT evidence of gallbladder inflammation however recommend ultrasound if of clinical concern.  Liver slightly enlarged spanning over 18.2 cm.  Please see above   Electronically Signed   By: Bridgett LarssonSteve  Olson M.D.   On: 10/08/2014 13:58   Medications: I have reviewed the patient's current medications. Scheduled Meds: . enoxaparin (LOVENOX) injection  40 mg Subcutaneous Q24H   Continuous Infusions: . sodium chloride 125 mL/hr at 10/09/14 0840   PRN Meds:.HYDROmorphone (DILAUDID) injection, ketorolac, ondansetron **OR** ondansetron (ZOFRAN) IV Assessment/Plan: Active Problems:   Partial small bowel obstruction  Abdominal pain  Patient is a 34 yo female with a history of ectopic pregnancy, multiple c-sections, and chronic constipation who presented with nausea and abdominal pain. CT showed fluid and gas-filled slightly dilated loops of small bowel, but point of obstruction was not identified.  Partial SBO: Patient is currently being treated with non-surgical management. She continues to be nauseas but has not vomited. She is currently afebrile and does not have a leukocytosis. Her bicarbonate level dropped to 19 from 27 yesterday which is slightly concerning for possible developing bowel ischemia. Her lactic acid, however, was normal at 0.7. A procalcitonin was also ordered as there is evidence of levels >0.17 predicting failure of nonoperative management. Her level was 0.13. However, given that her symptoms are not improved and she has  not yet passed flatus, surgery was consulted for any further recommendations. - NPO - I/O - continue IVF 125 mL/hr NS - Diluadid 1 mg IV Q3H prn  - Zofran 4 mg Q6H prn - CBC/BMET tomorrow am - surgery consulted, appreciate recomendations   This is a Psychologist, occupational Note.  The care of the patient was discussed with Dr. Senaida Ores and the assessment and plan formulated with their assistance.  Please see their attached note for official documentation of the daily encounter.   LOS: 1 day   Odessa Fleming, Med Student 10/09/2014, 3:04 PM

## 2014-10-09 NOTE — Consult Note (Signed)
Reason for Consult:SBO Referring Physician: Nischal Berneita Holloway is an 34 y.o. female.  HPI: Patricia Holloway was in her usual state of health until she woke up Sunday with abdominal pain. She denies any prior e/o this. She denies N/V associated with it. She called her OB as she had a c-section 6 months ago and they recommended she seek care in the ED if it continued to bother her. She did that Monday morning and had a CT scan that was consistent with a SBO. She was admitted, made npo, and had an NGT placed. She could not tolerate the NGT and pulled it out herself this morning 0500. She states she feels about the same now as she did when she came to the ED. She continues to deny N/V and is not passing flatus. She does have a hx/o chronic constipation.  Past Medical History  Diagnosis Date  . Ectopic pregnancy   . PONV (postoperative nausea and vomiting)   . Spinal headache   . Sickle cell trait   . Postpartum depression   . Partial small bowel obstruction 09/2014    Past Surgical History  Procedure Laterality Date  . Ectopic pregnancy surgery  2003  . Unilateral salpingectomy Right 2004  . Cesarean section N/A 04/06/2014    Procedure: REPEAT CESAREAN SECTION; Surgeon: Sanjuana Kava, MD; Location: [facility]; Service: Obstetrics; Laterality: N/A;    Family History  Problem Relation Age of Onset  . Heart disease Mother   . Sickle cell trait Father     Social History:  reports that she has been smoking. She has never used smokeless tobacco. She reports that she drinks about 0.6 oz of alcohol per week. She reports that she does not use illicit drugs.  Allergies:  Allergies  Allergen Reactions  . Pork-Derived Products Other (See Comments)    stomach upset when she eats pork    Medications: I have reviewed the patient's current medications.   Lab Results Last 48 Hours    Results for orders placed or performed  during the hospital encounter of 10/08/14 (from the past 48 hour(s))  CBC with Differential Status: Abnormal   Collection Time: 10/08/14 11:28 AM  Result Value Ref Range   WBC 12.1 (H) 4.0 - 10.5 K/uL   RBC 3.97 3.87 - 5.11 MIL/uL   Hemoglobin 11.3 (L) 12.0 - 15.0 g/dL   HCT 34.9 (L) 36.0 - 46.0 %   MCV 87.9 78.0 - 100.0 fL   MCH 28.5 26.0 - 34.0 pg   MCHC 32.4 30.0 - 36.0 g/dL   RDW 14.6 11.5 - 15.5 %   Platelets 327 150 - 400 K/uL   Neutrophils Relative % 88 (H) 43 - 77 %   Neutro Abs 10.6 (H) 1.7 - 7.7 K/uL   Lymphocytes Relative 9 (L) 12 - 46 %   Lymphs Abs 1.1 0.7 - 4.0 K/uL   Monocytes Relative 3 3 - 12 %   Monocytes Absolute 0.4 0.1 - 1.0 K/uL   Eosinophils Relative 0 0 - 5 %   Eosinophils Absolute 0.0 0.0 - 0.7 K/uL   Basophils Relative 0 0 - 1 %   Basophils Absolute 0.0 0.0 - 0.1 K/uL  Comprehensive metabolic panel Status: Abnormal   Collection Time: 10/08/14 11:28 AM  Result Value Ref Range   Sodium 138 135 - 145 mmol/L    Comment: Please note change in reference range.   Potassium 3.7 3.5 - 5.1 mmol/L    Comment: Please note change  in reference range.   Chloride 103 96 - 112 mEq/L   CO2 27 19 - 32 mmol/L   Glucose, Bld 103 (H) 70 - 99 mg/dL   BUN 7 6 - 23 mg/dL   Creatinine, Ser 0.96 0.50 - 1.10 mg/dL   Calcium 9.2 8.4 - 10.5 mg/dL   Total Protein 7.6 6.0 - 8.3 g/dL   Albumin 3.6 3.5 - 5.2 g/dL   AST 17 0 - 37 U/L   ALT 14 0 - 35 U/L   Alkaline Phosphatase 72 39 - 117 U/L   Total Bilirubin 0.9 0.3 - 1.2 mg/dL   GFR calc non Af Amer 77 (L) >90 mL/min   GFR calc Af Amer 89 (L) >90 mL/min    Comment: (NOTE) The eGFR has been calculated using the CKD EPI equation. This calculation has not been validated in all clinical situations. eGFR's persistently <90 mL/min signify possible Chronic  Kidney Disease.    Anion gap 8 5 - 15  Lipase, blood Status: None   Collection Time: 10/08/14 11:28 AM  Result Value Ref Range   Lipase 20 11 - 59 U/L  Pregnancy, urine Status: None   Collection Time: 10/08/14 12:40 PM  Result Value Ref Range   Preg Test, Ur NEGATIVE NEGATIVE    Comment:   THE SENSITIVITY OF THIS METHODOLOGY IS >20 mIU/mL.   Urinalysis, Routine w reflex microscopic Status: Abnormal   Collection Time: 10/08/14 12:40 PM  Result Value Ref Range   Color, Urine AMBER (A) YELLOW    Comment: BIOCHEMICALS MAY BE AFFECTED BY COLOR   APPearance HAZY (A) CLEAR   Specific Gravity, Urine 1.031 (H) 1.005 - 1.030   pH 6.0 5.0 - 8.0   Glucose, UA NEGATIVE NEGATIVE mg/dL   Hgb urine dipstick LARGE (A) NEGATIVE   Bilirubin Urine MODERATE (A) NEGATIVE   Ketones, ur 15 (A) NEGATIVE mg/dL   Protein, ur 100 (A) NEGATIVE mg/dL   Urobilinogen, UA 4.0 (H) 0.0 - 1.0 mg/dL   Nitrite NEGATIVE NEGATIVE   Leukocytes, UA SMALL (A) NEGATIVE  Urine microscopic-add on Status: Abnormal   Collection Time: 10/08/14 12:40 PM  Result Value Ref Range   Squamous Epithelial / LPF MANY (A) RARE   WBC, UA 7-10 <3 WBC/hpf   RBC / HPF 3-6 <3 RBC/hpf   Bacteria, UA MANY (A) RARE   Urine-Other MUCOUS PRESENT   Basic metabolic panel Status: None   Collection Time: 10/09/14 4:33 AM  Result Value Ref Range   Sodium 140 135 - 145 mmol/L    Comment: Please note change in reference range.   Potassium 4.0 3.5 - 5.1 mmol/L    Comment: Please note change in reference range.   Chloride 107 96 - 112 mEq/L   CO2 19 19 - 32 mmol/L   Glucose, Bld 87 70 - 99 mg/dL   BUN 9 6 - 23 mg/dL   Creatinine, Ser 0.80 0.50 - 1.10 mg/dL   Calcium 8.6 8.4 - 10.5 mg/dL   GFR calc non Af Amer >90 >90 mL/min   GFR calc Af Amer >90 >90  mL/min    Comment: (NOTE) The eGFR has been calculated using the CKD EPI equation. This calculation has not been validated in all clinical situations. eGFR's persistently <90 mL/min signify possible Chronic Kidney Disease.    Anion gap 14 5 - 15  CBC Status: Abnormal   Collection Time: 10/09/14 4:33 AM  Result Value Ref Range   WBC 6.4 4.0 - 10.5 K/uL  RBC 3.68 (L) 3.87 - 5.11 MIL/uL   Hemoglobin 10.7 (L) 12.0 - 15.0 g/dL   HCT 33.2 (L) 36.0 - 46.0 %   MCV 90.2 78.0 - 100.0 fL   MCH 29.1 26.0 - 34.0 pg   MCHC 32.2 30.0 - 36.0 g/dL   RDW 14.9 11.5 - 15.5 %   Platelets 315 150 - 400 K/uL  Hemoglobin A1c Status: None   Collection Time: 10/09/14 4:33 AM  Result Value Ref Range   Hgb A1c MFr Bld 4.8 <5.7 %    Comment: (NOTE)   According to the ADA Clinical Practice Recommendations for 2011, when HbA1c is used as a screening test: >=6.5% Diagnostic of Diabetes Mellitus  (if abnormal result is confirmed) 5.7-6.4% Increased risk of developing Diabetes Mellitus References:Diagnosis and Classification of Diabetes Mellitus,Diabetes WUJW,1191,47(WGNFA 1):S62-S69 and Standards of Medical Care in  Diabetes - 2011,Diabetes Care,2011,34 (Suppl 1):S11-S61.    Mean Plasma Glucose 91 <117 mg/dL    Comment: Performed at [facility]  Procalcitonin - Baseline Status: None   Collection Time: 10/09/14 4:33 AM  Result Value Ref Range   Procalcitonin 0.13 ng/mL    Comment:   Interpretation: PCT (Procalcitonin) <= 0.5 ng/mL: Systemic infection (sepsis) is not likely. Local bacterial infection is possible. (NOTE)  ICU PCT Algorithm Non ICU PCT Algorithm  ---------------------------- ------------------------------  PCT < 0.25 ng/mL PCT < 0.1  ng/mL  Stopping of antibiotics Stopping of antibiotics  strongly encouraged. strongly encouraged.  ---------------------------- ------------------------------  PCT level decrease by PCT < 0.25 ng/mL  >= 80% from peak PCT  OR PCT 0.25 - 0.5 ng/mL Stopping of antibiotics  encouraged.  Stopping of antibiotics  encouraged.  ---------------------------- ------------------------------  PCT level decrease by PCT >= 0.25 ng/mL  < 80% from peak PCT  AND PCT >= 0.5 ng/mL Continuin g antibiotics  encouraged.  Continuing antibiotics  encouraged.  ---------------------------- ------------------------------  PCT level increase compared PCT > 0.5 ng/mL  with peak PCT AND  PCT >= 0.5 ng/mL Escalation of antibiotics  strongly encouraged.  Escalation of antibiotics  strongly encouraged.   Lactic acid, plasma Status: None   Collection Time: 10/09/14 7:58 AM  Result Value Ref Range   Lactic Acid, Venous 0.7 0.5 - 2.2 mmol/L  Magnesium Status: None   Collection Time: 10/09/14 7:58 AM  Result Value Ref Range   Magnesium 2.1 1.5 - 2.5 mg/dL       Imaging Results (Last 48 hours)    Dg Abd 1 View  10/09/2014 CLINICAL DATA: Small bowel obstruction. EXAM: ABDOMEN - 1 VIEW COMPARISON: CT 10/08/2014 FINDINGS: Air contrast are present within the right colon and transverse colon. Paucity of gas within the left colon and rectosigmoid colon. Persistent air-filled mildly dilated small bowel loops in the left upper quadrant. There are minimal degenerative changes of the spine, sacroiliac joints and hips. IMPRESSION: A few air-filled mildly dilated  small bowel loops in left upper quadrant. Findings may be due to early/ partial small bowel obstruction. Electronically Signed By: Marin Olp M.D. On: 10/09/2014 12:54   Ct Abdomen Pelvis W Contrast  10/08/2014 CLINICAL DATA: 34 year old female with abdominal pain. Recent self administered enema. Initial encounter. EXAM: CT ABDOMEN AND PELVIS WITH CONTRAST TECHNIQUE: Multidetector CT imaging of the abdomen and pelvis was performed using the standard protocol following bolus administration of intravenous contrast. CONTRAST: 130m OMNIPAQUE IOHEXOL 300 MG/ML SOLN COMPARISON: None. FINDINGS: Fluid and gas-filled slightly dilated small bowel loops. Point of obstruction not identified. Change in caliber in the  ileum. This may be related to enteritis or possible partial small bowel obstruction from adhesions. The small amount of fluid in the pelvis may be related to this process as versus recent ovulation. Redundant slightly rotated colon at the level of the splenic flexure. No primary colonic abnormality noted. Gallstones largest measuring up to 2.4 cm. No CT evidence of gallbladder inflammation however recommend ultrasound if of clinical concern. Incidentally noted are are prominent vessels suggesting small varices left splenic renal region and mid anterior abdominal region. No evidence of major vessel thrombosis or findings to suggest cirrhosis. Liver slightly enlarged spanning over 18.2 cm. No worrisome hepatic, splenic, pancreatic, renal or adrenal lesion. Lung bases are clear. No free intraperitoneal air. No abdominal aortic aneurysm. No adenopathy. Decompressed non contrast filled urinary bladder without abnormality noted. Mild prominence uterine fundus. No worrisome adnexal mass identified. Degenerative changes L5-S1 with bilateral foraminal narrowing and spinal stenosis greater on the right. IMPRESSION: Fluid and gas-filled slightly dilated small bowel loops. Point of obstruction  not identified. Change in caliber in the ileum. This may be related to enteritis or possible partial small bowel obstruction from adhesions. The small amount of fluid in the pelvis may be related to this process as versus recent ovulation. Gallstones largest measuring up to 2.4 cm. No CT evidence of gallbladder inflammation however recommend ultrasound if of clinical concern. Liver slightly enlarged spanning over 18.2 cm. Please see above Electronically Signed By: Chauncey Cruel M.D. On: 10/08/2014 13:58     Review of Systems  Gastrointestinal: Positive for abdominal pain and constipation. Negative for nausea, vomiting and blood in stool.  Genitourinary: Negative for dysuria, urgency and frequency.   Blood pressure 125/83, pulse 98, temperature 98.4 F (36.9 C), temperature source Oral, resp. rate 17, height '5\' 4"'  (1.626 m), weight 213 lb 3.2 oz (96.707 kg), last menstrual period 10/08/2014, SpO2 98 %, unknown if currently breastfeeding. Physical Exam  Constitutional: She appears well-developed and well-nourished. No distress.  HENT:  Head: Normocephalic and atraumatic.  Eyes: Conjunctivae are normal.  Neck: Neck supple.  Cardiovascular: Normal rate, regular rhythm and normal heart sounds. Exam reveals no gallop and no friction rub.  No murmur heard. Respiratory: Effort normal and breath sounds normal. No respiratory distress. She has no wheezes. She has no rales.  GI: Soft. Bowel sounds are decreased. There is generalized tenderness. There is no rigidity, no rebound and no guarding.  Caesarian incision well-healed  Musculoskeletal: She exhibits no edema or tenderness.  Neurological: She is alert.  Skin: Skin is warm and dry. She is not diaphoretic.  Psychiatric: She has a normal mood and affect. Her behavior is normal.    Assessment/Plan: SBO -- She has fallen off the SBO protocol for contrast study. As she does not have N/V I do not feel strongly about the need to replace the  NGT at this point. Agree with continued npo and will repeat abd x-rays in am. I educated her on the likely cause and course of this illness. Should she worsen or fail to progress will need to discuss NGT replacement and possible surgery and she understands this. Cholelithiasis -- Asymptomatic   Lisette Abu, PA-C Pager: [phone] 10/09/2014, 2:54 PM

## 2014-10-09 NOTE — Consult Note (Deleted)
Reason for Consult:SBO Referring Physician: [name] is an 34 y.o. female.  HPI: Patricia Holloway was in her usual state of health until she woke up Sunday with abdominal pain. She denies any prior e/o this. She denies N/V associated with it. She called her OB as she had a c-section 6 months ago and they recommended she seek care in the ED if it continued to bother her. She did that Monday morning and had a CT scan that was consistent with a SBO. She was admitted, made npo, and had an NGT placed. She could not tolerate the NGT and pulled it out herself this morning 0500. She states she feels about the same now as she did when she came to the ED. She continues to deny N/V and is not passing flatus. She does have a hx/o chronic constipation.  Past Medical History  Diagnosis Date  . Ectopic pregnancy   . PONV (postoperative nausea and vomiting)   . Spinal headache   . Sickle cell trait   . Postpartum depression   . Partial small bowel obstruction 09/2014    Past Surgical History  Procedure Laterality Date  . Ectopic pregnancy surgery  2003  . Unilateral salpingectomy Right 2004  . Cesarean section N/A 04/06/2014    Procedure: REPEAT CESAREAN SECTION;  Surgeon: Sanjuana Kava, MD;  Location: [facility];  Service: Obstetrics;  Laterality: N/A;    Family History  Problem Relation Age of Onset  . Heart disease Mother   . Sickle cell trait Father     Social History:  reports that she has been smoking.  She has never used smokeless tobacco. She reports that she drinks about 0.6 oz of alcohol per week. She reports that she does not use illicit drugs.  Allergies:  Allergies  Allergen Reactions  . Pork-Derived Products Other (See Comments)    stomach upset when she eats pork    Medications: I have reviewed the patient's current medications.  Results for orders placed or performed during the hospital encounter of 10/08/14 (from the past 48 hour(s))  CBC with Differential     Status:  Abnormal   Collection Time: 10/08/14 11:28 AM  Result Value Ref Range   WBC 12.1 (H) 4.0 - 10.5 K/uL   RBC 3.97 3.87 - 5.11 MIL/uL   Hemoglobin 11.3 (L) 12.0 - 15.0 g/dL   HCT 34.9 (L) 36.0 - 46.0 %   MCV 87.9 78.0 - 100.0 fL   MCH 28.5 26.0 - 34.0 pg   MCHC 32.4 30.0 - 36.0 g/dL   RDW 14.6 11.5 - 15.5 %   Platelets 327 150 - 400 K/uL   Neutrophils Relative % 88 (H) 43 - 77 %   Neutro Abs 10.6 (H) 1.7 - 7.7 K/uL   Lymphocytes Relative 9 (L) 12 - 46 %   Lymphs Abs 1.1 0.7 - 4.0 K/uL   Monocytes Relative 3 3 - 12 %   Monocytes Absolute 0.4 0.1 - 1.0 K/uL   Eosinophils Relative 0 0 - 5 %   Eosinophils Absolute 0.0 0.0 - 0.7 K/uL   Basophils Relative 0 0 - 1 %   Basophils Absolute 0.0 0.0 - 0.1 K/uL  Comprehensive metabolic panel     Status: Abnormal   Collection Time: 10/08/14 11:28 AM  Result Value Ref Range   Sodium 138 135 - 145 mmol/L    Comment: Please note change in reference range.   Potassium 3.7 3.5 - 5.1 mmol/L  Comment: Please note change in reference range.   Chloride 103 96 - 112 mEq/L   CO2 27 19 - 32 mmol/L   Glucose, Bld 103 (H) 70 - 99 mg/dL   BUN 7 6 - 23 mg/dL   Creatinine, Ser 0.96 0.50 - 1.10 mg/dL   Calcium 9.2 8.4 - 10.5 mg/dL   Total Protein 7.6 6.0 - 8.3 g/dL   Albumin 3.6 3.5 - 5.2 g/dL   AST 17 0 - 37 U/L   ALT 14 0 - 35 U/L   Alkaline Phosphatase 72 39 - 117 U/L   Total Bilirubin 0.9 0.3 - 1.2 mg/dL   GFR calc non Af Amer 77 (L) >90 mL/min   GFR calc Af Amer 89 (L) >90 mL/min    Comment: (NOTE) The eGFR has been calculated using the CKD EPI equation. This calculation has not been validated in all clinical situations. eGFR's persistently <90 mL/min signify possible Chronic Kidney Disease.    Anion gap 8 5 - 15  Lipase, blood     Status: None   Collection Time: 10/08/14 11:28 AM  Result Value Ref Range   Lipase 20 11 - 59 U/L  Pregnancy, urine     Status: None   Collection Time: 10/08/14 12:40 PM  Result Value Ref Range   Preg Test, Ur  NEGATIVE NEGATIVE    Comment:        THE SENSITIVITY OF THIS METHODOLOGY IS >20 mIU/mL.   Urinalysis, Routine w reflex microscopic     Status: Abnormal   Collection Time: 10/08/14 12:40 PM  Result Value Ref Range   Color, Urine AMBER (A) YELLOW    Comment: BIOCHEMICALS MAY BE AFFECTED BY COLOR   APPearance HAZY (A) CLEAR   Specific Gravity, Urine 1.031 (H) 1.005 - 1.030   pH 6.0 5.0 - 8.0   Glucose, UA NEGATIVE NEGATIVE mg/dL   Hgb urine dipstick LARGE (A) NEGATIVE   Bilirubin Urine MODERATE (A) NEGATIVE   Ketones, ur 15 (A) NEGATIVE mg/dL   Protein, ur 100 (A) NEGATIVE mg/dL   Urobilinogen, UA 4.0 (H) 0.0 - 1.0 mg/dL   Nitrite NEGATIVE NEGATIVE   Leukocytes, UA SMALL (A) NEGATIVE  Urine microscopic-add on     Status: Abnormal   Collection Time: 10/08/14 12:40 PM  Result Value Ref Range   Squamous Epithelial / LPF MANY (A) RARE   WBC, UA 7-10 <3 WBC/hpf   RBC / HPF 3-6 <3 RBC/hpf   Bacteria, UA MANY (A) RARE   Urine-Other MUCOUS PRESENT   Basic metabolic panel     Status: None   Collection Time: 10/09/14  4:33 AM  Result Value Ref Range   Sodium 140 135 - 145 mmol/L    Comment: Please note change in reference range.   Potassium 4.0 3.5 - 5.1 mmol/L    Comment: Please note change in reference range.   Chloride 107 96 - 112 mEq/L   CO2 19 19 - 32 mmol/L   Glucose, Bld 87 70 - 99 mg/dL   BUN 9 6 - 23 mg/dL   Creatinine, Ser 0.80 0.50 - 1.10 mg/dL   Calcium 8.6 8.4 - 10.5 mg/dL   GFR calc non Af Amer >90 >90 mL/min   GFR calc Af Amer >90 >90 mL/min    Comment: (NOTE) The eGFR has been calculated using the CKD EPI equation. This calculation has not been validated in all clinical situations. eGFR's persistently <90 mL/min signify possible Chronic Kidney Disease.    Anion  gap 14 5 - 15  CBC     Status: Abnormal   Collection Time: 10/09/14  4:33 AM  Result Value Ref Range   WBC 6.4 4.0 - 10.5 K/uL   RBC 3.68 (L) 3.87 - 5.11 MIL/uL   Hemoglobin 10.7 (L) 12.0 - 15.0 g/dL    HCT 33.2 (L) 36.0 - 46.0 %   MCV 90.2 78.0 - 100.0 fL   MCH 29.1 26.0 - 34.0 pg   MCHC 32.2 30.0 - 36.0 g/dL   RDW 14.9 11.5 - 15.5 %   Platelets 315 150 - 400 K/uL  Hemoglobin A1c     Status: None   Collection Time: 10/09/14  4:33 AM  Result Value Ref Range   Hgb A1c MFr Bld 4.8 <5.7 %    Comment: (NOTE)                                                                       According to the ADA Clinical Practice Recommendations for 2011, when HbA1c is used as a screening test:  >=6.5%   Diagnostic of Diabetes Mellitus           (if abnormal result is confirmed) 5.7-6.4%   Increased risk of developing Diabetes Mellitus References:Diagnosis and Classification of Diabetes Mellitus,Diabetes TKZS,0109,32(TFTDD 1):S62-S69 and Standards of Medical Care in         Diabetes - 2011,Diabetes Care,2011,34 (Suppl 1):S11-S61.    Mean Plasma Glucose 91 <117 mg/dL    Comment: Performed at [facility]  Procalcitonin - Baseline     Status: None   Collection Time: 10/09/14  4:33 AM  Result Value Ref Range   Procalcitonin 0.13 ng/mL    Comment:        Interpretation: PCT (Procalcitonin) <= 0.5 ng/mL: Systemic infection (sepsis) is not likely. Local bacterial infection is possible. (NOTE)         ICU PCT Algorithm               Non ICU PCT Algorithm    ----------------------------     ------------------------------         PCT < 0.25 ng/mL                 PCT < 0.1 ng/mL     Stopping of antibiotics            Stopping of antibiotics       strongly encouraged.               strongly encouraged.    ----------------------------     ------------------------------       PCT level decrease by               PCT < 0.25 ng/mL       >= 80% from peak PCT       OR PCT 0.25 - 0.5 ng/mL          Stopping of antibiotics                                             encouraged.     Stopping of antibiotics  encouraged.    ----------------------------     ------------------------------        PCT level decrease by              PCT >= 0.25 ng/mL       < 80% from peak PCT        AND PCT >= 0.5 ng/mL            Continuin g antibiotics                                              encouraged.       Continuing antibiotics            encouraged.    ----------------------------     ------------------------------     PCT level increase compared          PCT > 0.5 ng/mL         with peak PCT AND          PCT >= 0.5 ng/mL             Escalation of antibiotics                                          strongly encouraged.      Escalation of antibiotics        strongly encouraged.   Lactic acid, plasma     Status: None   Collection Time: 10/09/14  7:58 AM  Result Value Ref Range   Lactic Acid, Venous 0.7 0.5 - 2.2 mmol/L  Magnesium     Status: None   Collection Time: 10/09/14  7:58 AM  Result Value Ref Range   Magnesium 2.1 1.5 - 2.5 mg/dL    Dg Abd 1 View  10/09/2014   CLINICAL DATA:  Small bowel obstruction.  EXAM: ABDOMEN - 1 VIEW  COMPARISON:  CT 10/08/2014  FINDINGS: Air contrast are present within the right colon and transverse colon. Paucity of gas within the left colon and rectosigmoid colon. Persistent air-filled mildly dilated small bowel loops in the left upper quadrant. There are minimal degenerative changes of the spine, sacroiliac joints and hips.  IMPRESSION: A few air-filled mildly dilated small bowel loops in left upper quadrant. Findings may be due to early/ partial small bowel obstruction.   Electronically Signed   By: Marin Olp M.D.   On: 10/09/2014 12:54   Ct Abdomen Pelvis W Contrast  10/08/2014   CLINICAL DATA:  34 year old female with abdominal pain. Recent self administered enema. Initial encounter.  EXAM: CT ABDOMEN AND PELVIS WITH CONTRAST  TECHNIQUE: Multidetector CT imaging of the abdomen and pelvis was performed using the standard protocol following bolus administration of intravenous contrast.  CONTRAST:  159m OMNIPAQUE IOHEXOL 300 MG/ML  SOLN   COMPARISON:  None.  FINDINGS: Fluid and gas-filled slightly dilated small bowel loops. Point of obstruction not identified. Change in caliber in the ileum. This may be related to enteritis or possible partial small bowel obstruction from adhesions. The small amount of fluid in the pelvis may be related to this process as versus recent ovulation.  Redundant slightly rotated colon at the level of the splenic flexure. No primary colonic abnormality noted.  Gallstones largest measuring up to 2.4 cm. No CT evidence  of gallbladder inflammation however recommend ultrasound if of clinical concern.  Incidentally noted are are prominent vessels suggesting small varices left splenic renal region and mid anterior abdominal region. No evidence of major vessel thrombosis or findings to suggest cirrhosis.  Liver slightly enlarged spanning over 18.2 cm. No worrisome hepatic, splenic, pancreatic, renal or adrenal lesion.  Lung bases are clear.  No free intraperitoneal air.  No abdominal aortic aneurysm.  No adenopathy.  Decompressed non contrast filled urinary bladder without abnormality noted. Mild prominence uterine fundus. No worrisome adnexal mass identified.  Degenerative changes L5-S1 with bilateral foraminal narrowing and spinal stenosis greater on the right.  IMPRESSION: Fluid and gas-filled slightly dilated small bowel loops. Point of obstruction not identified. Change in caliber in the ileum. This may be related to enteritis or possible partial small bowel obstruction from adhesions. The small amount of fluid in the pelvis may be related to this process as versus recent ovulation.  Gallstones largest measuring up to 2.4 cm. No CT evidence of gallbladder inflammation however recommend ultrasound if of clinical concern.  Liver slightly enlarged spanning over 18.2 cm.  Please see above   Electronically Signed   By: Chauncey Cruel M.D.   On: 10/08/2014 13:58    Review of Systems  Gastrointestinal: Positive for abdominal pain  and constipation. Negative for nausea, vomiting and blood in stool.  Genitourinary: Negative for dysuria, urgency and frequency.   Blood pressure 125/83, pulse 98, temperature 98.4 F (36.9 C), temperature source Oral, resp. rate 17, height '5\' 4"'  (1.626 m), weight 213 lb 3.2 oz (96.707 kg), last menstrual period 10/08/2014, SpO2 98 %, unknown if currently breastfeeding. Physical Exam  Constitutional: She appears well-developed and well-nourished. No distress.  HENT:  Head: Normocephalic and atraumatic.  Eyes: Conjunctivae are normal.  Neck: Neck supple.  Cardiovascular: Normal rate, regular rhythm and normal heart sounds.  Exam reveals no gallop and no friction rub.   No murmur heard. Respiratory: Effort normal and breath sounds normal. No respiratory distress. She has no wheezes. She has no rales.  GI: Soft. Bowel sounds are decreased. There is generalized tenderness. There is no rigidity, no rebound and no guarding.  Caesarian incision well-healed  Musculoskeletal: She exhibits no edema or tenderness.  Neurological: She is alert.  Skin: Skin is warm and dry. She is not diaphoretic.  Psychiatric: She has a normal mood and affect. Her behavior is normal.    Assessment/Plan: SBO -- She has fallen off the SBO protocol for contrast study. As she does not have N/V I do not feel strongly about the need to replace the NGT at this point. Agree with continued npo and will repeat abd x-rays in am. I educated her on the likely cause and course of this illness. Should she worsen or fail to progress will need to discuss NGT replacement and possible surgery and she understands this. Cholelithiasis -- Asymptomatic   Lisette Abu, PA-C Pager: [phone] 10/09/2014, 2:54 PM

## 2014-10-10 ENCOUNTER — Inpatient Hospital Stay (HOSPITAL_COMMUNITY): Payer: 59

## 2014-10-10 DIAGNOSIS — D649 Anemia, unspecified: Secondary | ICD-10-CM

## 2014-10-10 DIAGNOSIS — K565 Intestinal adhesions [bands] with obstruction (postprocedural) (postinfection): Principal | ICD-10-CM

## 2014-10-10 DIAGNOSIS — E162 Hypoglycemia, unspecified: Secondary | ICD-10-CM

## 2014-10-10 LAB — CBC
HEMATOCRIT: 25.4 % — AB (ref 36.0–46.0)
HEMOGLOBIN: 8.3 g/dL — AB (ref 12.0–15.0)
MCH: 28.5 pg (ref 26.0–34.0)
MCHC: 32.7 g/dL (ref 30.0–36.0)
MCV: 87.3 fL (ref 78.0–100.0)
Platelets: 277 10*3/uL (ref 150–400)
RBC: 2.91 MIL/uL — AB (ref 3.87–5.11)
RDW: 14.5 % (ref 11.5–15.5)
WBC: 4.9 10*3/uL (ref 4.0–10.5)

## 2014-10-10 LAB — GLUCOSE, CAPILLARY
GLUCOSE-CAPILLARY: 58 mg/dL — AB (ref 70–99)
Glucose-Capillary: 105 mg/dL — ABNORMAL HIGH (ref 70–99)
Glucose-Capillary: 96 mg/dL (ref 70–99)

## 2014-10-10 LAB — BASIC METABOLIC PANEL
Anion gap: 11 (ref 5–15)
BUN: 9 mg/dL (ref 6–23)
CHLORIDE: 108 meq/L (ref 96–112)
CO2: 18 mmol/L — AB (ref 19–32)
Calcium: 8 mg/dL — ABNORMAL LOW (ref 8.4–10.5)
Creatinine, Ser: 0.82 mg/dL (ref 0.50–1.10)
GFR calc Af Amer: >90 mL/min (ref >90)
GFR calc non Af Amer: >90 mL/min (ref >90)
GLUCOSE: 60 mg/dL — AB (ref 70–99)
POTASSIUM: 3.9 mmol/L (ref 3.5–5.1)
SODIUM: 137 mmol/L (ref 135–145)

## 2014-10-10 MED ORDER — DEXTROSE 50 % IV SOLN
INTRAVENOUS | Status: AC
  Administered 2014-10-10: 25 mL
  Filled 2014-10-10: qty 50

## 2014-10-10 MED ORDER — DEXTROSE-NACL 5-0.45 % IV SOLN
INTRAVENOUS | Status: DC
  Administered 2014-10-10 (×2): via INTRAVENOUS

## 2014-10-10 NOTE — Progress Notes (Signed)
Subjective:  Patient was seen and examined this morning. Patient had passage of flatus, but no stool. She continues to have abdominal pain. She is hungry and questioning when she can start a clear liquid diet.  Overnight Events: Patient had a hypoglycemic event at 58 with headache and dizziness. Given 25 mL of D50 which resolved hypoglycemia.  Objective: Vital signs in last 24 hours: Filed Vitals:   10/09/14 1350 10/09/14 1500 10/09/14 2153 10/10/14 0609  BP: 84/54 130/74 125/66 143/68  Pulse: 79  85 83  Temp: 98.6 F (37 C)  98.9 F (37.2 C) 98.3 F (36.8 C)  TempSrc: Oral  Oral   Resp: 16  18 18   Height:      Weight:    97.387 kg (214 lb 11.2 oz)  SpO2: 100%  100% 96%   Weight change: 1.134 kg (2 lb 8 oz)  Intake/Output Summary (Last 24 hours) at 10/10/14 1050 Last data filed at 10/10/14 0531  Gross per 24 hour  Intake 2814.58 ml  Output    500 ml  Net 2314.58 ml   General: Vital signs reviewed. Patient is well-developed and well-nourished, in no acute distress and cooperative with exam.  Cardiovascular: RRR, S1 normal, S2 normal, no murmurs, gallops, or rubs. Pulmonary/Chest: Clear to auscultation bilaterally, no wheezes, rales, or rhonchi. Abdominal: Soft, tender to palpation diffusely, hypoactive bowel sounds (present and improving), no guarding present.  Extremities: No lower extremity edema bilaterally Skin: Warm, dry and intact. No rashes or erythema. Psychiatric: Normal mood and affect. speech and behavior is normal. Cognition and memory are normal.   Lab Results: Basic Metabolic Panel:  Recent Labs Lab 10/09/14 0433 10/09/14 0758 10/10/14 0437  NA 140  --  137  K 4.0  --  3.9  CL 107  --  108  CO2 19  --  18*  GLUCOSE 87  --  60*  BUN 9  --  9  CREATININE 0.80  --  0.82  CALCIUM 8.6  --  8.0*  MG  --  2.1  --    Liver Function Tests:  Recent Labs Lab 10/08/14 1128  AST 17  ALT 14  ALKPHOS 72  BILITOT 0.9  PROT 7.6  ALBUMIN 3.6     Recent Labs Lab 10/08/14 1128  LIPASE 20   CBC:  Recent Labs Lab 10/08/14 1128 10/09/14 0433 10/10/14 0437  WBC 12.1* 6.4 4.9  NEUTROABS 10.6*  --   --   HGB 11.3* 10.7* 8.3*  HCT 34.9* 33.2* 25.4*  MCV 87.9 90.2 87.3  PLT 327 315 277   Hemoglobin A1C:  Recent Labs Lab 10/09/14 0433  HGBA1C 4.8   Urine Drug Screen: Drugs of Abuse     Component Value Date/Time   LABOPIA POSITIVE* 10/09/2014 1833   COCAINSCRNUR NONE DETECTED 10/09/2014 1833   LABBENZ NONE DETECTED 10/09/2014 1833   AMPHETMU NONE DETECTED 10/09/2014 1833   THCU POSITIVE* 10/09/2014 1833   LABBARB NONE DETECTED 10/09/2014 1833    Urinalysis:  Recent Labs Lab 10/08/14 1240  COLORURINE AMBER*  LABSPEC 1.031*  PHURINE 6.0  GLUCOSEU NEGATIVE  HGBUR LARGE*  BILIRUBINUR MODERATE*  KETONESUR 15*  PROTEINUR 100*  UROBILINOGEN 4.0*  NITRITE NEGATIVE  LEUKOCYTESUR SMALL*   Studies/Results: Dg Abd 1 View  10/09/2014   CLINICAL DATA:  Small bowel obstruction.  EXAM: ABDOMEN - 1 VIEW  COMPARISON:  CT 10/08/2014  FINDINGS: Air contrast are present within the right colon and transverse colon. Paucity of gas within the  left colon and rectosigmoid colon. Persistent air-filled mildly dilated small bowel loops in the left upper quadrant. There are minimal degenerative changes of the spine, sacroiliac joints and hips.  IMPRESSION: A few air-filled mildly dilated small bowel loops in left upper quadrant. Findings may be due to early/ partial small bowel obstruction.   Electronically Signed   By: Elberta Fortis M.D.   On: 10/09/2014 12:54   Ct Abdomen Pelvis W Contrast  10/08/2014   CLINICAL DATA:  34 year old female with abdominal pain. Recent self administered enema. Initial encounter.  EXAM: CT ABDOMEN AND PELVIS WITH CONTRAST  TECHNIQUE: Multidetector CT imaging of the abdomen and pelvis was performed using the standard protocol following bolus administration of intravenous contrast.  CONTRAST:   OMNIPAQUE IOHEXOL 300 MG/ML  SOLN  COMPARISON:  None.  FINDINGS: Fluid and gas-filled slightly dilated small bowel loops. Point of obstruction not identified. Change in caliber in the ileum. This may be related to enteritis or possible partial small bowel obstruction from adhesions. The small amount of fluid in the pelvis may be related to this process as versus recent ovulation.  Redundant slightly rotated colon at the level of the splenic flexure. No primary colonic abnormality noted.  Gallstones largest measuring up to 2.4 cm. No CT evidence of gallbladder inflammation however recommend ultrasound if of clinical concern.  Incidentally noted are are prominent vessels suggesting small varices left splenic renal region and mid anterior abdominal region. No evidence of major vessel thrombosis or findings to suggest cirrhosis.  Liver slightly enlarged spanning over 18.2 cm. No worrisome hepatic, splenic, pancreatic, renal or adrenal lesion.  Lung bases are clear.  No free intraperitoneal air.  No abdominal aortic aneurysm.  No adenopathy.  Decompressed non contrast filled urinary bladder without abnormality noted. Mild prominence uterine fundus. No worrisome adnexal mass identified.  Degenerative changes L5-S1 with bilateral foraminal narrowing and spinal stenosis greater on the right.  IMPRESSION: Fluid and gas-filled slightly dilated small bowel loops. Point of obstruction not identified. Change in caliber in the ileum. This may be related to enteritis or possible partial small bowel obstruction from adhesions. The small amount of fluid in the pelvis may be related to this process as versus recent ovulation.  Gallstones largest measuring up to 2.4 cm. No CT evidence of gallbladder inflammation however recommend ultrasound if of clinical concern.  Liver slightly enlarged spanning over 18.2 cm.  Please see above   Electronically Signed   By: Bridgett Larsson M.D.   On: 10/08/2014 13:58   Dg Abd Portable 1v  10/10/2014    CLINICAL DATA:  Small-bowel obstruction.  EXAM: PORTABLE ABDOMEN - 1 VIEW  COMPARISON:  10/09/2014.  FINDINGS: Soft tissue structures are unremarkable. Persistent dilated loops of small bowel are again noted in the left upper quadrant. Oral contrast in the colon. The colon is nondistended. No free air. Partial small bowel obstruction cannot be excluded. Pelvic calcifications cyst phleboliths. No acute bony abnormality .  IMPRESSION: Persistent distended loops of small bowel are again noted in the left upper quadrant. Oral contrast in the colon. These findings suggest partial small bowel obstruction or focal persistent ileus.   Electronically Signed   By: Maisie Fus  Register   On: 10/10/2014 08:08   Medications:  I have reviewed the patient's current medications. Prior to Admission:  Prescriptions prior to admission  Medication Sig Dispense Refill Last Dose  . Phenylephrine-Pheniramine-DM (THERAFLU COLD & COUGH PO) Take 10 mLs by mouth 2 (two) times daily as needed (for  cold symptoms).   10/07/2014 at Unknown time  . oxyCODONE-acetaminophen (PERCOCET/ROXICET) 5-325 MG per tablet Take 1-2 tablets by mouth every 4 (four) hours as needed for severe pain (moderate - severe pain). (Patient not taking: Reported on 10/08/2014) 30 tablet 0    Scheduled Meds: . antiseptic oral rinse  7 mL Mouth Rinse q12n4p  . chlorhexidine  15 mL Mouth Rinse BID  . enoxaparin (LOVENOX) injection  40 mg Subcutaneous Q24H   Continuous Infusions: . dextrose 5 % and 0.45% NaCl 75 mL/hr at 10/10/14 0757   PRN Meds:.HYDROmorphone (DILAUDID) injection, ketorolac, ondansetron **OR** ondansetron (ZOFRAN) IV Assessment/Plan: Active Problems:   Partial small bowel obstruction   Abdominal pain  Partial SBO 2/2 Adhesions: Patient has improved with passage of flatus. Her bowel sounds are more active than the days prior. However, abdomen seems more tender on exam today than previously. We will continue to keep patient NPO and run IVF as  below. Abdominal xray showed persistent distended loops of small bowel are again noted in the left upper quadrant. Oral contrast in the colon. These findings suggest partial small bowel obstruction or focal persistent ileus. No indication for NG tube at this time. We will try a clear liquid diet for lunch.  -Toradol 30 mg Q6H prn -Diluadid 1 mg IV Q3H prn  -Zofran 4 mg Q6H prn -D5-1/2 NS 75 mL/hr continuous -CBC/BMET tomorrow am -Serial monitoring -Clear liquid diet  -I/Os -Surgery following, appreciate recommendations  Hypoglycemia: Hypoglycemic event this morning 60>58>105 while NPO. Patient was given D50 25 mL. Patient is not a diabetic, hgbA1c 4.8 this admission. Advance to clear liquids. -Clear liquid diet -Change IVF to D5-1/2 NS at 75 cc/hr  DVT/PE ppx: Lovenox SQ daily  Dispo: Disposition is deferred at this time, awaiting improvement of current medical problems.  Anticipated discharge in approximately 1-2 day(s).   The patient does not have a current PCP (No Pcp Per Patient) and does need an [facility] hospital follow-up appointment after discharge.  The patient does not have transportation limitations that hinder transportation to clinic appointments.  .Services Needed at time of discharge: Y = Yes, Blank = No PT:   OT:   RN:   Equipment:   Other:     LOS: 2 days   Jill Alexanders, DO PGY-1 Internal Medicine Resident Pager # [phone] 10/10/2014 10:50 AM

## 2014-10-10 NOTE — Progress Notes (Signed)
Subjective: Patient continues to have same RLQ pain that is stabbing in nature. She has passed flatus and is up and walking around. Patient had an episode of hypoglycemia overnight where her headache woke her from sleep. Blood sugar was 60. Patient received a 25 mL 50% dextrose injection which helped. States she has never had this happen in the past.  Objective: Vital signs in last 24 hours: Filed Vitals:   10/09/14 1350 10/09/14 1500 10/09/14 2153 10/10/14 0609  BP: 84/54 130/74 125/66 143/68  Pulse: 79  85 83  Temp: 98.6 F (37 C)  98.9 F (37.2 C) 98.3 F (36.8 C)  TempSrc: Oral  Oral   Resp: Height:      Weight:    97.387 kg (214 lb 11.2 oz)  SpO2: 100%  100% 96%   Weight change: 1.134 kg (2 lb 8 oz)  Intake/Output Summary (Last 24 hours) at 10/10/14 0855 Last data filed at 10/10/14 0531  Gross per 24 hour  Intake 2814.58 ml  Output    500 ml  Net 2314.58 ml   General: Patient is well-developed and well-nourished, in no acute distress and cooperative with exam.  Cardiovascular: RRR, no murmurs, gallops, or rubs. Pulmonary/Chest: Clear to auscultation bilaterally, no wheezes, rales, or rhonchi. Abdominal: Distended, RLQ tenderness, bowel sounds present, no masses, organomegaly, or guarding present.  Extremities: No lower extremity edema bilaterally Skin: Warm, dry and intact. No rashes or erythema. Psychiatric: Normal mood and affect. speech and behavior is normal. Cognition and memory are normal.   Lab Results: Basic Metabolic Panel:  Recent Labs Lab 10/09/14 0433 10/09/14 0758 10/10/14 0437  NA 140  --  137  K 4.0  --  3.9  CL 107  --  108  CO2 19  --  18*  GLUCOSE 87  --  60*  BUN 9  --  9  CREATININE 0.80  --  0.82  CALCIUM 8.6  --  8.0*  MG  --  2.1  --    Liver Function Tests:  Recent Labs Lab 10/08/14 1128  AST 17  ALT 14  ALKPHOS 72  BILITOT 0.9  PROT 7.6  ALBUMIN 3.6    Recent Labs Lab 10/08/14 1128  LIPASE 20   No  results for input(s): AMMONIA in the last 168 hours. CBC:  Recent Labs Lab 10/08/14 1128 10/09/14 0433 10/10/14 0437  WBC 12.1* 6.4 4.9  NEUTROABS 10.6*  --   --   HGB 11.3* 10.7* 8.3*  HCT 34.9* 33.2* 25.4*  MCV 87.9 90.2 87.3  PLT 327 315 277   Cardiac Enzymes: No results for input(s): CKTOTAL, CKMB, CKMBINDEX, TROPONINI in the last 168 hours. BNP: No results for input(s): PROBNP in the last 168 hours. D-Dimer: No results for input(s): DDIMER in the last 168 hours. CBG:  Recent Labs Lab 10/10/14 0620 10/10/14 0652  GLUCAP 58* 105*   Hemoglobin A1C:  Recent Labs Lab 10/09/14 0433  HGBA1C 4.8   Fasting Lipid Panel: No results for input(s): CHOL, HDL, LDLCALC, TRIG, CHOLHDL, LDLDIRECT in the last 168 hours. Thyroid Function Tests: No results for input(s): TSH, T4TOTAL, FREET4, T3FREE, THYROIDAB in the last 168 hours. Coagulation: No results for input(s): LABPROT, INR in the last 168 hours. Anemia Panel: No results for input(s): VITAMINB12, FOLATE, FERRITIN, TIBC, IRON, RETICCTPCT in the last 168 hours. Urine Drug Screen: Drugs of Abuse     Component Value Date/Time   LABOPIA POSITIVE* 10/09/2014 1833   COCAINSCRNUR NONE  DETECTED 10/09/2014 1833   LABBENZ NONE DETECTED 10/09/2014 1833   AMPHETMU NONE DETECTED 10/09/2014 1833   THCU POSITIVE* 10/09/2014 1833   LABBARB NONE DETECTED 10/09/2014 1833    Alcohol Level: No results for input(s): ETH in the last 168 hours. Urinalysis:  Recent Labs Lab 10/08/14 1240  COLORURINE AMBER*  LABSPEC 1.031*  PHURINE 6.0  GLUCOSEU NEGATIVE  HGBUR LARGE*  BILIRUBINUR MODERATE*  KETONESUR 15*  PROTEINUR 100*  UROBILINOGEN 4.0*  NITRITE NEGATIVE  LEUKOCYTESUR SMALL*    Micro Results: No results found for this or any previous visit (from the past 240 hour(s)). Studies/Results: Dg Abd 1 View  10/09/2014   CLINICAL DATA:  Small bowel obstruction.  EXAM: ABDOMEN - 1 VIEW  COMPARISON:  CT 10/08/2014  FINDINGS: Air  contrast are present within the right colon and transverse colon. Paucity of gas within the left colon and rectosigmoid colon. Persistent air-filled mildly dilated small bowel loops in the left upper quadrant. There are minimal degenerative changes of the spine, sacroiliac joints and hips.  IMPRESSION: A few air-filled mildly dilated small bowel loops in left upper quadrant. Findings may be due to early/ partial small bowel obstruction.   Electronically Signed   By: Elberta Fortisaniel  Boyle M.D.   On: 10/09/2014 12:54   Ct Abdomen Pelvis W Contrast  10/08/2014   CLINICAL DATA:  34 year old female with abdominal pain. Recent self administered enema. Initial encounter.  EXAM: CT ABDOMEN AND PELVIS WITH CONTRAST  TECHNIQUE: Multidetector CT imaging of the abdomen and pelvis was performed using the standard protocol following bolus administration of intravenous contrast.  CONTRAST:  100mL OMNIPAQUE IOHEXOL 300 MG/ML  SOLN  COMPARISON:  None.  FINDINGS: Fluid and gas-filled slightly dilated small bowel loops. Point of obstruction not identified. Change in caliber in the ileum. This may be related to enteritis or possible partial small bowel obstruction from adhesions. The small amount of fluid in the pelvis may be related to this process as versus recent ovulation.  Redundant slightly rotated colon at the level of the splenic flexure. No primary colonic abnormality noted.  Gallstones largest measuring up to 2.4 cm. No CT evidence of gallbladder inflammation however recommend ultrasound if of clinical concern.  Incidentally noted are are prominent vessels suggesting small varices left splenic renal region and mid anterior abdominal region. No evidence of major vessel thrombosis or findings to suggest cirrhosis.  Liver slightly enlarged spanning over 18.2 cm. No worrisome hepatic, splenic, pancreatic, renal or adrenal lesion.  Lung bases are clear.  No free intraperitoneal air.  No abdominal aortic aneurysm.  No adenopathy.   Decompressed non contrast filled urinary bladder without abnormality noted. Mild prominence uterine fundus. No worrisome adnexal mass identified.  Degenerative changes L5-S1 with bilateral foraminal narrowing and spinal stenosis greater on the right.  IMPRESSION: Fluid and gas-filled slightly dilated small bowel loops. Point of obstruction not identified. Change in caliber in the ileum. This may be related to enteritis or possible partial small bowel obstruction from adhesions. The small amount of fluid in the pelvis may be related to this process as versus recent ovulation.  Gallstones largest measuring up to 2.4 cm. No CT evidence of gallbladder inflammation however recommend ultrasound if of clinical concern.  Liver slightly enlarged spanning over 18.2 cm.  Please see above   Electronically Signed   By: Bridgett LarssonSteve  Olson M.D.   On: 10/08/2014 13:58   Dg Abd Portable 1v  10/10/2014   CLINICAL DATA:  Small-bowel obstruction.  EXAM: PORTABLE ABDOMEN -  1 VIEW  COMPARISON:  10/09/2014.  FINDINGS: Soft tissue structures are unremarkable. Persistent dilated loops of small bowel are again noted in the left upper quadrant. Oral contrast in the colon. The colon is nondistended. No free air. Partial small bowel obstruction cannot be excluded. Pelvic calcifications cyst phleboliths. No acute bony abnormality .  IMPRESSION: Persistent distended loops of small bowel are again noted in the left upper quadrant. Oral contrast in the colon. These findings suggest partial small bowel obstruction or focal persistent ileus.   Electronically Signed   By: Maisie Fus  Register   On: 10/10/2014 08:08   Medications: I have reviewed the patient's current medications. Scheduled Meds: . antiseptic oral rinse  7 mL Mouth Rinse q12n4p  . chlorhexidine  15 mL Mouth Rinse BID  . enoxaparin (LOVENOX) injection  40 mg Subcutaneous Q24H   Continuous Infusions: . dextrose 5 % and 0.45% NaCl 75 mL/hr at 10/10/14 0757   PRN Meds:.HYDROmorphone  (DILAUDID) injection, ketorolac, ondansetron **OR** ondansetron (ZOFRAN) IV Assessment/Plan: Active Problems:   Partial small bowel obstruction   Abdominal pain  Patient is a 34 yo female with a history of ectopic pregnancy, multiple c-sections, and chronic constipation who presented with nausea and abdominal pain. CT showed fluid and gas-filled slightly dilated loops of small bowel, but point of obstruction was not identified. Patient is improving.  Partial SBO: Patient is currently being treated with non-surgical management. She has passed flatus and her pain is improving. Repeat abdominal x-ray showed persistent dilated loops of small bowel in the upper left quadrant with contrast in the colon. Given that patient is improving, will continue medical management and monitor for any worsening symptoms. - advance to liquids - I/O - IVF 75 mL/hr D5-1/2 NS - Toradol 30 mg Q6H prn - Diluadid 1 mg IV Q3H prn  - Zofran 4 mg Q6H prn - CBC/BMET tomorrow am - surgery followng, appreciate further recommendations  Hypoglycemia: Patient had an episode of hypoglycemia this morning with glucose of 60>58>105. She received a 25 mL D50 injection with resolution of symptoms. Patient is not diabetic. - IVF changed to D5-1/2NS 74 mL/hr - start clear liquid diet  This is a Psychologist, occupational Note.  The care of the patient was discussed with Dr. Senaida Ores and the assessment and plan formulated with their assistance.  Please see their attached note for official documentation of the daily encounter.   LOS: 2 days   Odessa Fleming, Med Student 10/10/2014, 8:55 AM

## 2014-10-10 NOTE — Progress Notes (Addendum)
Hypoglycemic Event  CBG: 60 per blood draw at 0437, then CBG:58 per glucometer at 0620    Treatment: 25mL 50% Dextrose Injection 25 grams  Symptoms: headache  Follow-up CBG: [id number] CBG Result: 105   Possible Reasons for Event: NPO with ice chips.  NS IV fluid @ 125 mL/hr  Comments/MD notified: Paged On call  MD Mallory    [name]  Remember to initiate Hypoglycemia Order Set & complete

## 2014-10-10 NOTE — Progress Notes (Addendum)
[facility] Progress Note Subjective: Doing better today. Passing flatus but no BM yet. Still with RUQ and RLQ abdominal pain, but reports this to be improving. States she is hungry and would like to begin to eat or drink something. No N/V. Had an episode of hypoglycemia overnight.   Objective: Vital signs in last 24 hours: Temp:  [98.3 F (36.8 C)-98.9 F (37.2 C)] 98.3 F (36.8 C) (01/20 0609) Pulse Rate:  [79-85] 83 (01/20 0609) Resp:  [16-18] 18 (01/20 0609) BP: (84-143)/(54-74) 143/68 mmHg (01/20 0609) SpO2:  [96 %-100 %] 96 % (01/20 0609) Weight:  [97.387 kg (214 lb 11.2 oz)] 97.387 kg (214 lb 11.2 oz) (01/20 0609) Last BM Date: 10/06/14  Intake/Output from previous day: 01/19 0701 - 01/20 0700 In: 2814.6 [I.V.:2814.6] Out: 500 [Urine:500] Intake/Output this shift:    General appearance: alert, cooperative, appears stated age and no distress Resp: clear to auscultation bilaterally Cardio: regular rate and rhythm GI: soft, non-distended, few BS, still with moderate tenderness RUQ and RLQ of abdomen  Lab Results:   Recent Labs  10/09/14 0433 10/10/14 0437  WBC 6.4 4.9  HGB 10.7* 8.3*  HCT 33.2* 25.4*  PLT 315 277   BMET  Recent Labs  10/09/14 0433 10/10/14 0437  NA 140 137  K 4.0 3.9  CL 107 108  CO2 19 18*  GLUCOSE 87 60*  BUN 9 9  CREATININE 0.80 0.82  CALCIUM 8.6 8.0*   PT/INR No results for input(s): LABPROT, INR in the last 72 hours. ABG No results for input(s): PHART, HCO3 in the last 72 hours.  Invalid input(s): PCO2, PO2  Studies/Results: Dg Abd 1 View  10/09/2014   CLINICAL DATA:  Small bowel obstruction.  EXAM: ABDOMEN - 1 VIEW  COMPARISON:  CT 10/08/2014  FINDINGS: Air contrast are present within the right colon and transverse colon. Paucity of gas within the left colon and rectosigmoid colon. Persistent air-filled mildly dilated small bowel loops in the left upper quadrant. There are minimal degenerative changes of the  spine, sacroiliac joints and hips.  IMPRESSION: A few air-filled mildly dilated small bowel loops in left upper quadrant. Findings may be due to early/ partial small bowel obstruction.   Electronically Signed   By: Elberta Fortisaniel  Boyle M.D.   On: 10/09/2014 12:54   Ct Abdomen Pelvis W Contrast  10/08/2014   CLINICAL DATA:  34 year old female with abdominal pain. Recent self administered enema. Initial encounter.  EXAM: CT ABDOMEN AND PELVIS WITH CONTRAST  TECHNIQUE: Multidetector CT imaging of the abdomen and pelvis was performed using the standard protocol following bolus administration of intravenous contrast.  CONTRAST:  100mL OMNIPAQUE IOHEXOL 300 MG/ML  SOLN  COMPARISON:  None.  FINDINGS: Fluid and gas-filled slightly dilated small bowel loops. Point of obstruction not identified. Change in caliber in the ileum. This may be related to enteritis or possible partial small bowel obstruction from adhesions. The small amount of fluid in the pelvis may be related to this process as versus recent ovulation.  Redundant slightly rotated colon at the level of the splenic flexure. No primary colonic abnormality noted.  Gallstones largest measuring up to 2.4 cm. No CT evidence of gallbladder inflammation however recommend ultrasound if of clinical concern.  Incidentally noted are are prominent vessels suggesting small varices left splenic renal region and mid anterior abdominal region. No evidence of major vessel thrombosis or findings to suggest cirrhosis.  Liver slightly enlarged spanning over 18.2 cm. No worrisome hepatic, splenic, pancreatic, renal or  adrenal lesion.  Lung bases are clear.  No free intraperitoneal air.  No abdominal aortic aneurysm.  No adenopathy.  Decompressed non contrast filled urinary bladder without abnormality noted. Mild prominence uterine fundus. No worrisome adnexal mass identified.  Degenerative changes L5-S1 with bilateral foraminal narrowing and spinal stenosis greater on the right.   IMPRESSION: Fluid and gas-filled slightly dilated small bowel loops. Point of obstruction not identified. Change in caliber in the ileum. This may be related to enteritis or possible partial small bowel obstruction from adhesions. The small amount of fluid in the pelvis may be related to this process as versus recent ovulation.  Gallstones largest measuring up to 2.4 cm. No CT evidence of gallbladder inflammation however recommend ultrasound if of clinical concern.  Liver slightly enlarged spanning over 18.2 cm.  Please see above   Electronically Signed   By: Bridgett Larsson M.D.   On: 10/08/2014 13:58   Dg Abd Portable 1v  10/10/2014   CLINICAL DATA:  Small-bowel obstruction.  EXAM: PORTABLE ABDOMEN - 1 VIEW  COMPARISON:  10/09/2014.  FINDINGS: Soft tissue structures are unremarkable. Persistent dilated loops of small bowel are again noted in the left upper quadrant. Oral contrast in the colon. The colon is nondistended. No free air. Partial small bowel obstruction cannot be excluded. Pelvic calcifications cyst phleboliths. No acute bony abnormality .  IMPRESSION: Persistent distended loops of small bowel are again noted in the left upper quadrant. Oral contrast in the colon. These findings suggest partial small bowel obstruction or focal persistent ileus.   Electronically Signed   By: Maisie Fus  Register   On: 10/10/2014 08:08    Anti-infectives: Anti-infectives    None      Assessment/Plan: s/p * No surgery found * Active Problems:  Partial small bowel obstruction  Abdominal pain Anemia  Partial SBO 2/2 Adhesions: Patient has improved with passage of flatus. Her bowel sounds are more active than the days prior. However, still with abdominal tenderness on exam. Diet advanced to clears per medicine service. Abdominal xray from today showed persistent distended loops of small bowel in LUQ, however, oral contrast visible in the colon. These findings suggest partial small bowel obstruction or focal  persistent ileus. No clinical indication for NG tube at this time. To try a clear liquid diet for lunch. Continue OOB/Ambulate. H/H 8.3/25 today (down from 10.7/33). Patient is >2L positive for fluid balance???Hemodilution??. Will need to monitor H/H closely. ?Is patient menstruating. -Toradol 30 mg Q6H prn -Diluadid 1 mg IV Q3H prn  -Zofran 4 mg Q6H prn -D5-1/2 NS 75 mL/hr continuous -CBC/BMET tomorrow am -Serial monitoring -Clear liquid diet  -I/Os   LOS: 2 days    Ardis Rowan [facility] Pager [phone] 10/10/2014  Pt seen and examined this pm around 4. Reports BM, flatus but still burping. No n Asleep, easily awakes Soft, nd, mild TTP; no guarding/rebound  pSBO improving Would keep on fulls today given ongoing burping  Mary Sella. Andrey Campanile, MD, FACS General, Bariatric, & Minimally Invasive Surgery [facility], [place]

## 2014-10-11 ENCOUNTER — Encounter (HOSPITAL_COMMUNITY): Payer: Self-pay | Admitting: *Deleted

## 2014-10-11 LAB — CBC
HCT: 25.3 % — ABNORMAL LOW (ref 36.0–46.0)
HEMOGLOBIN: 8.3 g/dL — AB (ref 12.0–15.0)
MCH: 28 pg (ref 26.0–34.0)
MCHC: 32.8 g/dL (ref 30.0–36.0)
MCV: 85.5 fL (ref 78.0–100.0)
Platelets: 284 10*3/uL (ref 150–400)
RBC: 2.96 MIL/uL — ABNORMAL LOW (ref 3.87–5.11)
RDW: 14.3 % (ref 11.5–15.5)
WBC: 3 10*3/uL — AB (ref 4.0–10.5)

## 2014-10-11 LAB — BASIC METABOLIC PANEL
Anion gap: 5 (ref 5–15)
BUN: <5 mg/dL — ABNORMAL LOW (ref 6–23)
CO2: 26 mmol/L (ref 19–32)
Calcium: 8.3 mg/dL — ABNORMAL LOW (ref 8.4–10.5)
Chloride: 108 mEq/L (ref 96–112)
Creatinine, Ser: 0.66 mg/dL (ref 0.50–1.10)
GFR calc Af Amer: >90 mL/min (ref >90)
GFR calc non Af Amer: >90 mL/min (ref >90)
Glucose, Bld: 93 mg/dL (ref 70–99)
Potassium: 3.3 mmol/L — ABNORMAL LOW (ref 3.5–5.1)
Sodium: 139 mmol/L (ref 135–145)

## 2014-10-11 LAB — PROCALCITONIN: Procalcitonin: <0.1 ng/mL

## 2014-10-11 MED ORDER — POTASSIUM CHLORIDE CRYS ER 20 MEQ PO TBCR
40.0000 meq | EXTENDED_RELEASE_TABLET | Freq: Once | ORAL | Status: AC
  Administered 2014-10-11: 40 meq via ORAL
  Filled 2014-10-11: qty 2

## 2014-10-11 NOTE — Progress Notes (Signed)
Patient ID: Patricia Holloway, female   DOB: 1981/05/07, 34 y.o.   MRN: [id number]    Subjective: Pt feels much better today. No pain, no nausea.  Tolerating full liquids.  Had a BM this morning  Objective: Vital signs in last 24 hours: Temp:  [98.2 F (36.8 C)-98.5 F (36.9 C)] 98.5 F (36.9 C) (01/21 0554) Pulse Rate:  [54-62] 55 (01/21 0554) Resp:  [17-18] 17 (01/21 0554) BP: (113-123)/(68-75) 113/68 mmHg (01/21 0554) SpO2:  [98 %-100 %] 99 % (01/21 0554) Weight:  [216 lb 6.4 oz (98.158 kg)] 216 lb 6.4 oz (98.158 kg) (01/21 0500) Last BM Date: 10/10/14  Intake/Output from previous day: 01/20 0701 - 01/21 0700 In: 2635 [P.O.:1020; I.V.:1615] Out: -  Intake/Output this shift:    PE: Abd: soft, NT, ND, +BS  Lab Results:   Recent Labs  10/10/14 0437 10/11/14 0500  WBC 4.9 3.0*  HGB 8.3* 8.3*  HCT 25.4* 25.3*  PLT 277 284   BMET  Recent Labs  10/10/14 0437 10/11/14 0500  NA 137 139  K 3.9 3.3*  CL 108 108  CO2 18* 26  GLUCOSE 60* 93  BUN 9 <5*  CREATININE 0.82 0.66  CALCIUM 8.0* 8.3*   PT/INR No results for input(s): LABPROT, INR in the last 72 hours. CMP     Component Value Date/Time   NA 139 10/11/2014 0500   K 3.3* 10/11/2014 0500   CL 108 10/11/2014 0500   CO2 26 10/11/2014 0500   GLUCOSE 93 10/11/2014 0500   BUN <5* 10/11/2014 0500   CREATININE 0.66 10/11/2014 0500   CALCIUM 8.3* 10/11/2014 0500   PROT 7.6 10/08/2014 1128   ALBUMIN 3.6 10/08/2014 1128   AST 17 10/08/2014 1128   ALT 14 10/08/2014 1128   ALKPHOS 72 10/08/2014 1128   BILITOT 0.9 10/08/2014 1128   GFRNONAA >90 10/11/2014 0500   GFRAA >90 10/11/2014 0500   Lipase     Component Value Date/Time   LIPASE 20 10/08/2014 1128       Studies/Results: Dg Abd 1 View  10/09/2014   CLINICAL DATA:  Small bowel obstruction.  EXAM: ABDOMEN - 1 VIEW  COMPARISON:  CT 10/08/2014  FINDINGS: Air contrast are present within the right colon and transverse colon. Paucity of gas within the  left colon and rectosigmoid colon. Persistent air-filled mildly dilated small bowel loops in the left upper quadrant. There are minimal degenerative changes of the spine, sacroiliac joints and hips.  IMPRESSION: A few air-filled mildly dilated small bowel loops in left upper quadrant. Findings may be due to early/ partial small bowel obstruction.   Electronically Signed   By: Elberta Fortis M.D.   On: 10/09/2014 12:54   Dg Abd Portable 1v  10/10/2014   CLINICAL DATA:  Small-bowel obstruction.  EXAM: PORTABLE ABDOMEN - 1 VIEW  COMPARISON:  10/09/2014.  FINDINGS: Soft tissue structures are unremarkable. Persistent dilated loops of small bowel are again noted in the left upper quadrant. Oral contrast in the colon. The colon is nondistended. No free air. Partial small bowel obstruction cannot be excluded. Pelvic calcifications cyst phleboliths. No acute bony abnormality .  IMPRESSION: Persistent distended loops of small bowel are again noted in the left upper quadrant. Oral contrast in the colon. These findings suggest partial small bowel obstruction or focal persistent ileus.   Electronically Signed   By: Maisie Fus  Register   On: 10/10/2014 08:08    Anti-infectives: Anti-infectives    None  Assessment/Plan  1. Psbo, improving  Plan: 1.  Advanced to regular diet by primary service.  If patient tolerates this, she is stable for dc home from our standpoint. No surgical interventions planned.  We will sign off.  LOS: 3 days    Asjia Berrios E 10/11/2014, 9:12 AM Pager: [phone]

## 2014-10-11 NOTE — Progress Notes (Signed)
Subjective: Patient is feeling much better. She had 2 bowel movements the day before. Her pain is resolved and she would like to try a regular diet.  Objective: Vital signs in last 24 hours: Filed Vitals:   10/10/14 2212 10/11/14 0500 10/11/14 0554 10/11/14 0939  BP: 120/69  113/68 125/69  Pulse: 62  55 54  Temp: 98.2 F (36.8 C)  98.5 F (36.9 C) 98.8 F (37.1 C)  TempSrc: Oral   Oral  Resp: 18  17   Height:      Weight:  98.158 kg (216 lb 6.4 oz)    SpO2: 98%  99% 100%   Weight change: 0.771 kg (1 lb 11.2 oz)  Intake/Output Summary (Last 24 hours) at 10/11/14 0955 Last data filed at 10/11/14 [id number]  Gross per 24 hour  Intake   2635 ml  Output      0 ml  Net   2635 ml   General: Patient is well-developed and well-nourished, in no acute distress and cooperative with exam.  Cardiovascular: RRR, no murmurs, gallops, or rubs. Pulmonary/Chest: Clear to auscultation bilaterally, no wheezes, rales, or rhonchi. Abdominal: no tenderness to palpation, bowel sounds present, no masses, organomegaly, or guarding present.  Extremities: No lower extremity edema bilaterally Skin: Warm, dry and intact. No rashes or erythema. Psychiatric: Normal mood and affect. speech and behavior is normal. Cognition and memory are normal.   Lab Results: Basic Metabolic Panel:  Recent Labs Lab 10/09/14 0758 10/10/14 0437 10/11/14 0500  NA  --  137 139  K  --  3.9 3.3*  CL  --  108 108  CO2  --  18* 26  GLUCOSE  --  60* 93  BUN  --  9 <5*  CREATININE  --  0.82 0.66  CALCIUM  --  8.0* 8.3*  MG 2.1  --   --    Liver Function Tests:  Recent Labs Lab 10/08/14 1128  AST 17  ALT 14  ALKPHOS 72  BILITOT 0.9  PROT 7.6  ALBUMIN 3.6    Recent Labs Lab 10/08/14 1128  LIPASE 20   No results for input(s): AMMONIA in the last 168 hours. CBC:  Recent Labs Lab 10/08/14 1128  10/10/14 0437 10/11/14 0500  WBC 12.1*  < > 4.9 3.0*  NEUTROABS 10.6*  --   --   --   HGB 11.3*  < > 8.3*  8.3*  HCT 34.9*  < > 25.4* 25.3*  MCV 87.9  < > 87.3 85.5  PLT 327  < > 277 284  < > = values in this interval not displayed. Cardiac Enzymes: No results for input(s): CKTOTAL, CKMB, CKMBINDEX, TROPONINI in the last 168 hours. BNP: No results for input(s): PROBNP in the last 168 hours. D-Dimer: No results for input(s): DDIMER in the last 168 hours. CBG:  Recent Labs Lab 10/10/14 0620 10/10/14 0652 10/10/14 2329  GLUCAP 58* 105* 96   Hemoglobin A1C:  Recent Labs Lab 10/09/14 0433  HGBA1C 4.8   Fasting Lipid Panel: No results for input(s): CHOL, HDL, LDLCALC, TRIG, CHOLHDL, LDLDIRECT in the last 168 hours. Thyroid Function Tests: No results for input(s): TSH, T4TOTAL, FREET4, T3FREE, THYROIDAB in the last 168 hours. Coagulation: No results for input(s): LABPROT, INR in the last 168 hours. Anemia Panel: No results for input(s): VITAMINB12, FOLATE, FERRITIN, TIBC, IRON, RETICCTPCT in the last 168 hours. Urine Drug Screen: Drugs of Abuse     Component Value Date/Time   LABOPIA POSITIVE* 10/09/2014 1833  COCAINSCRNUR NONE DETECTED 10/09/2014 1833   LABBENZ NONE DETECTED 10/09/2014 1833   AMPHETMU NONE DETECTED 10/09/2014 1833   THCU POSITIVE* 10/09/2014 1833   LABBARB NONE DETECTED 10/09/2014 1833    Alcohol Level: No results for input(s): ETH in the last 168 hours. Urinalysis:  Recent Labs Lab 10/08/14 1240  COLORURINE AMBER*  LABSPEC 1.031*  PHURINE 6.0  GLUCOSEU NEGATIVE  HGBUR LARGE*  BILIRUBINUR MODERATE*  KETONESUR 15*  PROTEINUR 100*  UROBILINOGEN 4.0*  NITRITE NEGATIVE  LEUKOCYTESUR SMALL*    Micro Results: No results found for this or any previous visit (from the past 240 hour(s)). Studies/Results: Dg Abd 1 View  10/09/2014   CLINICAL DATA:  Small bowel obstruction.  EXAM: ABDOMEN - 1 VIEW  COMPARISON:  CT 10/08/2014  FINDINGS: Air contrast are present within the right colon and transverse colon. Paucity of gas within the left colon and  rectosigmoid colon. Persistent air-filled mildly dilated small bowel loops in the left upper quadrant. There are minimal degenerative changes of the spine, sacroiliac joints and hips.  IMPRESSION: A few air-filled mildly dilated small bowel loops in left upper quadrant. Findings may be due to early/ partial small bowel obstruction.   Electronically Signed   By: Elberta Fortis M.D.   On: 10/09/2014 12:54   Dg Abd Portable 1v  10/10/2014   CLINICAL DATA:  Small-bowel obstruction.  EXAM: PORTABLE ABDOMEN - 1 VIEW  COMPARISON:  10/09/2014.  FINDINGS: Soft tissue structures are unremarkable. Persistent dilated loops of small bowel are again noted in the left upper quadrant. Oral contrast in the colon. The colon is nondistended. No free air. Partial small bowel obstruction cannot be excluded. Pelvic calcifications cyst phleboliths. No acute bony abnormality .  IMPRESSION: Persistent distended loops of small bowel are again noted in the left upper quadrant. Oral contrast in the colon. These findings suggest partial small bowel obstruction or focal persistent ileus.   Electronically Signed   By: Maisie Fus  Register   On: 10/10/2014 08:08   Medications: I have reviewed the patient's current medications. Scheduled Meds: . antiseptic oral rinse  7 mL Mouth Rinse q12n4p  . chlorhexidine  15 mL Mouth Rinse BID  . enoxaparin (LOVENOX) injection  40 mg Subcutaneous Q24H   Continuous Infusions:   PRN Meds:.ondansetron **OR** ondansetron (ZOFRAN) IV Assessment/Plan: Active Problems:   Partial small bowel obstruction   Abdominal pain  Patient is a 34 yo female with a history of ectopic pregnancy, multiple c-sections, and chronic constipation who presented with nausea and abdominal pain. CT showed fluid and gas-filled slightly dilated loops of small bowel, but point of obstruction was not identified. Patient is much improved and has had 2 bowel movements.  Partial SBO: Patient is passing flatus and has had 2 normal  bowel movements. She was able to tolerate a liquid diet and will be advanced to a full, regular diet. If she tolerates diet, will be able to go home today. - full diet - discontinue IVF    Hypoglycemia: Patient had an episode of hypoglycemia this morning with glucose of 60>58>105. She received a 25 mL D50 injection with resolution of symptoms. Patient is not diabetic. - resolved - regular diet  This is a Psychologist, occupational Note.  The care of the patient was discussed with Dr. Senaida Ores and the assessment and plan formulated with their assistance.  Please see their attached note for official documentation of the daily encounter.   LOS: 3 days   Odessa Fleming, Med Student 10/11/2014, 9:55  AM

## 2014-10-11 NOTE — Progress Notes (Signed)
Patient ate her lunch and denies any pain and nausea, tolerated meal. Discharge home per order. Discharge instruction given to patient . No questions verbalized.

## 2014-10-11 NOTE — Progress Notes (Signed)
Subjective:  Patient was seen and examined this morning. Patient had two regular bowel movements yesterday and has been passing flatus. She denies any abdominal pain, this is resolved. Patient tolerated clear liquid diet and would like to try a regular diet.   Objective: Vital signs in last 24 hours: Filed Vitals:   10/10/14 2212 10/11/14 0500 10/11/14 0554 10/11/14 0939  BP: 120/69  113/68 125/69  Pulse: 62  55 54  Temp: 98.2 F (36.8 C)  98.5 F (36.9 C) 98.8 F (37.1 C)  TempSrc: Oral   Oral  Resp: 18  17   Height:      Weight:  216 lb 6.4 oz (98.158 kg)    SpO2: 98%  99% 100%   Weight change: 1 lb 11.2 oz (0.771 kg)  Intake/Output Summary (Last 24 hours) at 10/11/14 0953 Last data filed at 10/11/14 0529  Gross per 24 hour  Intake   2635 ml  Output      0 ml  Net   2635 ml   General: Vital signs reviewed. Patient is well-developed and well-nourished, in no acute distress and cooperative with exam.  Cardiovascular: RRR, S1 normal, S2 normal, no murmurs, gallops, or rubs. Pulmonary/Chest: Clear to auscultation bilaterally, no wheezes, rales, or rhonchi. Abdominal: Soft, non-tender to palpation, normoactive bowel sounds, no guarding present.  Extremities: No lower extremity edema bilaterally Skin: Warm, dry and intact. No rashes or erythema. Psychiatric: Normal mood and affect. speech and behavior is normal. Cognition and memory are normal.   Lab Results: Basic Metabolic Panel:  Recent Labs Lab 10/09/14 0758 10/10/14 0437 10/11/14 0500  NA  --  137 139  K  --  3.9 3.3*  CL  --  108 108  CO2  --  18* 26  GLUCOSE  --  60* 93  BUN  --  9 <5*  CREATININE  --  0.82 0.66  CALCIUM  --  8.0* 8.3*  MG 2.1  --   --    Liver Function Tests:  Recent Labs Lab 10/08/14 1128  AST 17  ALT 14  ALKPHOS 72  BILITOT 0.9  PROT 7.6  ALBUMIN 3.6    Recent Labs Lab 10/08/14 1128  LIPASE 20   CBC:  Recent Labs Lab 10/08/14 1128  10/10/14 0437 10/11/14 0500    WBC 12.1*  < > 4.9 3.0*  NEUTROABS 10.6*  --   --   --   HGB 11.3*  < > 8.3* 8.3*  HCT 34.9*  < > 25.4* 25.3*  MCV 87.9  < > 87.3 85.5  PLT 327  < > 277 284  < > = values in this interval not displayed. Hemoglobin A1C:  Recent Labs Lab 10/09/14 0433  HGBA1C 4.8   Urine Drug Screen: Drugs of Abuse     Component Value Date/Time   LABOPIA POSITIVE* 10/09/2014 1833   COCAINSCRNUR NONE DETECTED 10/09/2014 1833   LABBENZ NONE DETECTED 10/09/2014 1833   AMPHETMU NONE DETECTED 10/09/2014 1833   THCU POSITIVE* 10/09/2014 1833   LABBARB NONE DETECTED 10/09/2014 1833    Urinalysis:  Recent Labs Lab 10/08/14 1240  COLORURINE AMBER*  LABSPEC 1.031*  PHURINE 6.0  GLUCOSEU NEGATIVE  HGBUR LARGE*  BILIRUBINUR MODERATE*  KETONESUR 15*  PROTEINUR 100*  UROBILINOGEN 4.0*  NITRITE NEGATIVE  LEUKOCYTESUR SMALL*   Studies/Results: Dg Abd 1 View  10/09/2014   CLINICAL DATA:  Small bowel obstruction.  EXAM: ABDOMEN - 1 VIEW  COMPARISON:  CT 10/08/2014  FINDINGS: Air  contrast are present within the right colon and transverse colon. Paucity of gas within the left colon and rectosigmoid colon. Persistent air-filled mildly dilated small bowel loops in the left upper quadrant. There are minimal degenerative changes of the spine, sacroiliac joints and hips.  IMPRESSION: A few air-filled mildly dilated small bowel loops in left upper quadrant. Findings may be due to early/ partial small bowel obstruction.   Electronically Signed   By: Elberta Fortis M.D.   On: 10/09/2014 12:54   Dg Abd Portable 1v  10/10/2014   CLINICAL DATA:  Small-bowel obstruction.  EXAM: PORTABLE ABDOMEN - 1 VIEW  COMPARISON:  10/09/2014.  FINDINGS: Soft tissue structures are unremarkable. Persistent dilated loops of small bowel are again noted in the left upper quadrant. Oral contrast in the colon. The colon is nondistended. No free air. Partial small bowel obstruction cannot be excluded. Pelvic calcifications cyst phleboliths.  No acute bony abnormality .  IMPRESSION: Persistent distended loops of small bowel are again noted in the left upper quadrant. Oral contrast in the colon. These findings suggest partial small bowel obstruction or focal persistent ileus.   Electronically Signed   By: Maisie Fus  Register   On: 10/10/2014 08:08   Medications:  I have reviewed the patient's current medications. Prior to Admission:  Prescriptions prior to admission  Medication Sig Dispense Refill Last Dose  . Phenylephrine-Pheniramine-DM (THERAFLU COLD & COUGH PO) Take 10 mLs by mouth 2 (two) times daily as needed (for cold symptoms).   10/07/2014 at Unknown time  . oxyCODONE-acetaminophen (PERCOCET/ROXICET) 5-325 MG per tablet Take 1-2 tablets by mouth every 4 (four) hours as needed for severe pain (moderate - severe pain). (Patient not taking: Reported on 10/08/2014) 30 tablet 0    Scheduled Meds: . antiseptic oral rinse  7 mL Mouth Rinse q12n4p  . chlorhexidine  15 mL Mouth Rinse BID  . enoxaparin (LOVENOX) injection  40 mg Subcutaneous Q24H   Continuous Infusions:   PRN Meds:.ondansetron **OR** ondansetron (ZOFRAN) IV Assessment/Plan: Active Problems:   Partial small bowel obstruction   Abdominal pain  Partial SBO 2/2 Adhesions: Patient is now passing flatus and having normal bowel movements. No abdominal pain. Tolerating clear liquid diet. No other complaints. This was discussed with surgery who agrees with advancing to regular diet. If patient tolerates this, she can be discharged home. Patient will follow up with me in 2 weeks in the [facility] clinic.  -D/C IVF -Regular diet -Surgery has signed off -Follow up with [facility] on 10/25/14  DVT/PE ppx: Lovenox SQ daily  Dispo: Anticipated discharge today.  The patient does not have a current PCP (No Pcp Per Patient) and does need an [facility] hospital follow-up appointment after discharge.  The patient does not have transportation limitations that hinder transportation to clinic  appointments.  .Services Needed at time of discharge: Y = Yes, Blank = No PT:   OT:   RN:   Equipment:   Other:     LOS: 3 days   Jill Alexanders, DO PGY-1 Internal Medicine Resident Pager # [phone] 10/11/2014 9:53 AM

## 2014-10-11 NOTE — Discharge Instructions (Signed)
Thank you for allowing us to be involved in your healthcare while you were hospitalized at [facility].   Please note that there have been changes to your home medications.  --> PLEASE LOOK AT YOUR DISCHARGE MEDICATION LIST FOR DETAILS.  Please call your PCP if you have any questions or concerns, or any difficulty getting any of your medications.  Please return to the ER if you have worsening of your symptoms or new severe symptoms arise.  If you abdominal worsens, or you have trouble passing flatus or having a bowel movement please call the internal medicine clinic with the number listed below.  Small Bowel Obstruction A small bowel obstruction is a blockage (obstruction) of the small intestine (small bowel). The small bowel is a long, slender tube that connects the stomach to the colon. Its job is to absorb nutrients from the fluids and foods you consume into the bloodstream.  CAUSES  There are many causes of intestinal blockage. The most common ones include:  Hernias. This is a more common cause in children than adults.  Inflammatory bowel disease (enteritis and colitis).  Twisting of the bowel (volvulus).  Tumors.  Scar tissue (adhesions) from previous surgery or radiation treatment.  Recent surgery. This may cause an acute small bowel obstruction called an ileus. SYMPTOMS   Abdominal pain. This may be dull cramps or sharp pain. It may occur in one area or may be present in the entire abdomen. Pain can range from mild to severe, depending on the degree of obstruction.  Nausea and vomiting. Vomit may be greenish or yellow bile color.  Distended or swollen stomach. Abdominal bloating is a common symptom.  Constipation.  Lack of passing gas.  Frequent belching.  Diarrhea. This may occur if runny stool is able to leak around the obstruction. DIAGNOSIS  Your caregiver can usually diagnose small bowel obstruction by taking a history, doing a physical exam,  and taking X-rays. If the cause is unclear, a CT scan (computerized tomography) of your abdomen and pelvis may be needed. TREATMENT  Treatment of the blockage depends on the cause and how bad the problem is.   Sometimes, the obstruction improves with bed rest and intravenous (IV) fluids.  Resting the bowel is very important. This means following a simple diet. Sometimes, a clear liquid diet may be required for several days.  Sometimes, a small tube (nasogastric tube) is placed into the stomach to decompress the bowel. When the bowel is blocked, it usually swells up like a balloon filled with air and fluids. Decompression means that the air and fluids are removed by suction through that tube. This can help with pain, discomfort, and nausea. It can also help the obstruction resolve faster.  Surgery may be required if other treatments do not work. Bowel obstruction from a hernia may require early surgery and can be an emergency procedure. Adhesions that cause frequent or severe obstructions may also require surgery. HOME CARE INSTRUCTIONS If your bowel obstruction is only partial or incomplete, you may be allowed to go home.  Get plenty of rest.  Follow your diet as directed by your caregiver.  Only consume clear liquids until your condition improves.  Avoid solid foods as instructed. SEEK IMMEDIATE MEDICAL CARE IF:  You have increased pain or cramping.  You vomit blood.  You have uncontrolled vomiting or nausea.  You cannot drink fluids due to vomiting or pain.  You develop confusion.  You begin feeling very dry or thirsty (dehydrated).  You have severe bloating.  You have chills.  You have a fever.  You feel extremely weak or you faint. MAKE SURE YOU:  Understand these instructions.  Will watch your condition.  Will get help right away if you are not doing well or get worse. Document Released: 11/24/2005 Document Revised: 11/30/2011 Document Reviewed:  11/21/2010 [facility] Patient Information 2015 [place], [place]. This information is not intended to replace advice given to you by your health care provider. Make sure you discuss any questions you have with your health care provider.

## 2014-10-11 NOTE — Discharge Summary (Signed)
Name: Patricia Holloway MRN: [id number] DOB: 03-Apr-1981 [age] y.o. PCP: No Pcp Per Patient  Date of Admission: 10/08/2014 11:35 AM Date of Discharge: 10/30/2014 Attending Physician: Dr. Heide SparkNarendra  Discharge Diagnosis: Active Problems:   Partial small bowel obstruction   Abdominal pain  Discharge Medications:   Medication List    STOP taking these medications        oxyCODONE-acetaminophen 5-325 MG per tablet  Commonly known as:  PERCOCET/ROXICET     THERAFLU COLD & COUGH PO        Disposition and follow-up:   Patricia Holloway [facility] Good [facility].  At the hospital follow up visit please address:  1.  Please discuss resolution of abdominal pain as well as long term bowel regimen for chronic constipation instead of regular enema use.  2.  Labs / imaging needed at time of follow-up: none  3.  Pending labs/ test needing follow-up: none  Follow-up Appointments: Follow-up Information    Follow up with [name], [name], MD On 10/25/2014.   Specialty:  Internal Medicine   Why:  3:15 pm   Contact information:   [address] [place] [address] [phone]       Discharge Instructions: Discharge Instructions    Diet - low sodium heart healthy    Complete by:  As directed      Increase activity slowly    Complete by:  As directed            Consultations:  Surgery  Procedures Performed:  Dg Abd 1 View  10/09/2014   CLINICAL DATA:  Small bowel obstruction.  EXAM: ABDOMEN - 1 VIEW  COMPARISON:  CT 10/08/2014  FINDINGS: Air contrast are present within the right colon and transverse colon. Paucity of gas within the left colon and rectosigmoid colon. Persistent air-filled mildly dilated small bowel loops in the left upper quadrant. There are minimal degenerative changes of the spine, sacroiliac joints and hips.  IMPRESSION: A few air-filled mildly dilated small bowel loops in left upper quadrant. Findings may be due to early/  partial small bowel obstruction.   Electronically Signed   By: Elberta Fortisaniel  Boyle M.D.   On: 10/09/2014 12:54   Ct Abdomen Pelvis W Contrast  10/08/2014   CLINICAL DATA:  34 year old female with abdominal pain. Recent self administered enema. Initial encounter.  EXAM: CT ABDOMEN AND PELVIS WITH CONTRAST  TECHNIQUE: Multidetector CT imaging of the abdomen and pelvis was performed using the standard protocol following bolus administration of intravenous contrast.  CONTRAST:  100mL OMNIPAQUE IOHEXOL 300 MG/ML  SOLN  COMPARISON:  None.  FINDINGS: Fluid and gas-filled slightly dilated small bowel loops. Point of obstruction not identified. Change in caliber in the ileum. This may be related to enteritis or possible partial small bowel obstruction from adhesions. The small amount of fluid in the pelvis may be related to this process as versus recent ovulation.  Redundant slightly rotated colon at the level of the splenic flexure. No primary colonic abnormality noted.  Gallstones largest measuring up to 2.4 cm. No CT evidence of gallbladder inflammation however recommend ultrasound if of clinical concern.  Incidentally noted are are prominent vessels suggesting small varices left splenic renal region and mid anterior abdominal region. No evidence of major vessel thrombosis or findings to suggest cirrhosis.  Liver slightly enlarged spanning over 18.2 cm. No worrisome hepatic, splenic, pancreatic, renal or adrenal lesion.  Lung bases are clear.  No free intraperitoneal air.  No abdominal aortic aneurysm.  No adenopathy.  Decompressed non contrast filled urinary bladder without abnormality noted. Mild prominence uterine fundus. No worrisome adnexal mass identified.  Degenerative changes L5-S1 with bilateral foraminal narrowing and spinal stenosis greater on the right.  IMPRESSION: Fluid and gas-filled slightly dilated small bowel loops. Point of obstruction not identified. Change in caliber in the ileum. This may be related to  enteritis or possible partial small bowel obstruction from adhesions. The small amount of fluid in the pelvis may be related to this process as versus recent ovulation.  Gallstones largest measuring up to 2.4 cm. No CT evidence of gallbladder inflammation however recommend ultrasound if of clinical concern.  Liver slightly enlarged spanning over 18.2 cm.  Please see above   Electronically Signed   By: Bridgett Larsson M.D.   On: 10/08/2014 13:58   Dg Abd Portable 1v  10/10/2014   CLINICAL DATA:  Small-bowel obstruction.  EXAM: PORTABLE ABDOMEN - 1 VIEW  COMPARISON:  10/09/2014.  FINDINGS: Soft tissue structures are unremarkable. Persistent dilated loops of small bowel are again noted in the left upper quadrant. Oral contrast in the colon. The colon is nondistended. No free air. Partial small bowel obstruction cannot be excluded. Pelvic calcifications cyst phleboliths. No acute bony abnormality .  IMPRESSION: Persistent distended loops of small bowel are again noted in the left upper quadrant. Oral contrast in the colon. These findings suggest partial small bowel obstruction or focal persistent ileus.   Electronically Signed   By: Maisie Fus  Register   On: 10/10/2014 08:08     Admission HPI: Patricia Holloway is a 34 yo female with PMHx of ectopic pregnancy s/p removal of fallopian tube and s/p C-sections who presents to the ED with abdominal pain. Patient states she has a history of constipation ever since her first C-section in 2005. Since then, she has had 4 additional C-sections with her last one in July 2015. Patient also had a salpingectomy in 2004 from an ectopic pregnancy. She normal takes laxatives or uses enemas to relieve her constipation. She normally goes every 4 days, but must use a bowel regimen. Patient used an enema 2 days ago and had a resultant large, non-bloody bowel movement. Patient awoke the next morning feeling distended, bloating, and with sharp right sided abdominal pain 7/10 radiating medially.  Patient tried to relieve the gas on her own, but her pain worsened and patient decided to present to the ED. She has loss of appetite and has not eaten in 24 hours. She denies fever, chest pain, shortness of breath, nausea, vomiting or diarrhea. She has not passed gas or stool in almost 2 days.   Hospital Course by problem list: Active Problems:   Partial small bowel obstruction   Abdominal pain   Partial Small Bowel Obstruction: Patient originally presented with abdominal pain, distention and loss of appetite for 2 days. On admission she was afebrile, normotensive and had a moderate leukocytosis of 12.1. A CT of her abdomen showed fluid and gas- filled slightly dilated small bowel loops without a point of obstruction visible. She was appropriate for non-surgical management and had an NG tube placed, made NPO and started on IVF. The following day patient had a drop in her bicarbonate from 27 to 19. Her drop in bicarbonate and the fact that she was not passing any gas prompted the medical team to get a consult from surgery. They recommended continued medical management. During her hospitalization patient began passing flatus and eventually she had 2  bowel movements on day 3. Her diet was advanced and patient was discharged in stable condition.  Discharge Vitals:   BP 125/69 mmHg  Pulse 54  Temp(Src) 98.8 F (37.1 C) (Oral)  Resp 20  Ht  (1.626 m)  Wt 216 lb 6.4 oz (98.158 kg)  BMI 37.13 kg/m2  SpO2 100%  LMP 10/08/2014   Signed: Jill Alexanders, DO PGY-1 Internal Medicine Resident Pager # [phone] 10/30/2014 10:31 AM    Services Ordered on Discharge: none Equipment Ordered on Discharge: none

## 2014-10-25 ENCOUNTER — Encounter: Payer: Self-pay | Admitting: Internal Medicine

## 2014-10-25 ENCOUNTER — Ambulatory Visit: Payer: 59 | Admitting: Internal Medicine

## 2014-10-30 ENCOUNTER — Encounter: Payer: Self-pay | Admitting: Internal Medicine

## 2015-01-28 ENCOUNTER — Emergency Department (HOSPITAL_COMMUNITY): Payer: 59

## 2015-01-28 ENCOUNTER — Emergency Department (HOSPITAL_COMMUNITY)
Admission: EM | Admit: 2015-01-28 | Discharge: 2015-01-28 | Disposition: A | Discharge status: Home / Self Care | Payer: 59 | Attending: Emergency Medicine | Admitting: Emergency Medicine

## 2015-01-28 ENCOUNTER — Encounter (HOSPITAL_COMMUNITY): Payer: Self-pay | Admitting: Emergency Medicine

## 2015-01-28 DIAGNOSIS — Y92410 Unspecified street and highway as the place of occurrence of the external cause: Secondary | ICD-10-CM | POA: Insufficient documentation

## 2015-01-28 DIAGNOSIS — Y998 Other external cause status: Secondary | ICD-10-CM | POA: Diagnosis not present

## 2015-01-28 DIAGNOSIS — Z862 Personal history of diseases of the blood and blood-forming organs and certain disorders involving the immune mechanism: Secondary | ICD-10-CM | POA: Insufficient documentation

## 2015-01-28 DIAGNOSIS — Y9389 Activity, other specified: Secondary | ICD-10-CM | POA: Diagnosis not present

## 2015-01-28 DIAGNOSIS — Z8669 Personal history of other diseases of the nervous system and sense organs: Secondary | ICD-10-CM | POA: Diagnosis not present

## 2015-01-28 DIAGNOSIS — S199XXA Unspecified injury of neck, initial encounter: Secondary | ICD-10-CM | POA: Diagnosis not present

## 2015-01-28 DIAGNOSIS — S3992XA Unspecified injury of lower back, initial encounter: Secondary | ICD-10-CM | POA: Diagnosis not present

## 2015-01-28 DIAGNOSIS — Z8659 Personal history of other mental and behavioral disorders: Secondary | ICD-10-CM | POA: Insufficient documentation

## 2015-01-28 DIAGNOSIS — Z8719 Personal history of other diseases of the digestive system: Secondary | ICD-10-CM | POA: Insufficient documentation

## 2015-01-28 DIAGNOSIS — Z720 Tobacco use: Secondary | ICD-10-CM | POA: Diagnosis not present

## 2015-01-28 DIAGNOSIS — S29092A Other injury of muscle and tendon of back wall of thorax, initial encounter: Secondary | ICD-10-CM | POA: Diagnosis not present

## 2015-01-28 MED ORDER — METHOCARBAMOL 500 MG PO TABS
500.0000 mg | ORAL_TABLET | Freq: Two times a day (BID) | ORAL | Status: DC
Start: 2015-01-28 — End: 2015-05-28

## 2015-01-28 MED ORDER — NAPROXEN 500 MG PO TABS: 500.0000 mg | ORAL_TABLET | Freq: Two times a day (BID) | ORAL | Status: DC

## 2015-01-28 MED ORDER — HYDROCODONE-ACETAMINOPHEN 5-325 MG PO TABS
2.0000 | ORAL_TABLET | Freq: Once | ORAL | Status: AC
  Administered 2015-01-28: 2 via ORAL
  Filled 2015-01-28: qty 2
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
## 2015-01-28 NOTE — ED Notes (Signed)
Heart Rate 44-52. Status reviewed with PA. Previous chart documents a similar heart rate by anesthesia. PO challenge of 300 cc given./ Pt denies pain. reminded that she needs to return for head pain, nausea or vomiting
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
## 2015-03-25 ENCOUNTER — Encounter (HOSPITAL_COMMUNITY): Payer: Self-pay | Admitting: Emergency Medicine

## 2015-03-25 ENCOUNTER — Emergency Department (INDEPENDENT_AMBULATORY_CARE_PROVIDER_SITE_OTHER)
Admission: EM | Admit: 2015-03-25 | Discharge: 2015-03-25 | Disposition: A | Discharge status: Home / Self Care | Payer: 59 | Source: Home / Self Care

## 2015-03-25 DIAGNOSIS — R51 Headache: Secondary | ICD-10-CM | POA: Diagnosis not present

## 2015-03-25 DIAGNOSIS — J029 Acute pharyngitis, unspecified: Secondary | ICD-10-CM

## 2015-03-25 DIAGNOSIS — R519 Headache, unspecified: Secondary | ICD-10-CM

## 2015-03-25 DIAGNOSIS — B084 Enteroviral vesicular stomatitis with exanthem: Secondary | ICD-10-CM

## 2015-03-25 LAB — POCT RAPID STREP A: Streptococcus, Group A Screen (Direct): NEGATIVE

## 2015-03-25 MED ORDER — HYDROCORTISONE VALERATE 0.2 % EX CREA: 1.0000 "application " | TOPICAL_CREAM | Freq: Two times a day (BID) | CUTANEOUS | Status: DC

## 2015-03-25 NOTE — ED Provider Notes (Signed)
CSN: [id number]     Arrival date & time 03/25/15  1541 History   None    Chief Complaint  Patient presents with  . Rash   (Consider location/radiation/quality/duration/timing/severity/associated sxs/prior Treatment) Patient is a 34 y.o. female presenting with rash. The history is provided by the patient.  Rash Location:  Hand, leg, head/neck and ano-genital Hand rash location:  L palm and R palm Ano-genital rash location:  L buttock Leg rash location:  L foot and R foot Quality: blistering, itchiness and peeling   Severity:  Moderate Onset quality:  Gradual Duration:  3 weeks Progression:  Spreading Relieved by:  Nothing Associated symptoms: headaches and sore throat   Associated symptoms: no diarrhea, no fever, no shortness of breath and not vomiting   Headaches:    Severity:  Moderate   Onset quality:  Gradual   Timing:  Intermittent   Chronicity:  Recurrent Sore throat:    Severity:  Moderate   Onset quality:  Gradual   Duration:  1 week   Timing:  Unable to specify   Progression:  Unchanged   Past Medical History  Diagnosis Date  . Ectopic pregnancy   . PONV (postoperative nausea and vomiting)   . Spinal headache   . Sickle cell trait   . Postpartum depression   . Partial small bowel obstruction 09/2014   Past Surgical History  Procedure Laterality Date  . Ectopic pregnancy surgery  2003  . Unilateral salpingectomy Right 2004  . Cesarean section N/A 04/06/2014    Procedure: REPEAT CESAREAN SECTION;  Surgeon: Essie HartWalda Pinn, MD;  Location: [facility];  Service: Obstetrics;  Laterality: N/A;   Family History  Problem Relation Age of Onset  . Heart disease Mother   . Sickle cell trait Father    History  Substance Use Topics  . Smoking status: Current Some Day Smoker -- 0.50 packs/day for 5 years  . Smokeless tobacco: Never Used  . Alcohol Use: 0.6 oz/week    1 Cans of beer per week   OB History    Gravida Para Term Preterm AB TAB SAB Ectopic Multiple Living   8  5 3  3 1  2  5      Review of Systems  Unable to perform ROS Constitutional: Negative for fever.  HENT: Positive for sore throat.   Respiratory: Negative for shortness of breath.   Cardiovascular: Negative.   Gastrointestinal: Negative.  Negative for vomiting and diarrhea.  Skin: Positive for rash.  Neurological: Positive for headaches.  All other systems reviewed and are negative.   Allergies  Pork-derived products  Home Medications   Prior to Admission medications   Medication Sig Start Date End Date Taking? Authorizing Provider  methocarbamol (ROBAXIN) 500 MG tablet Take 1 tablet (500 mg total) by mouth 2 (two) times daily. 01/28/15   Heather Laisure, PA-C  naproxen (NAPROSYN) 500 MG tablet Take 1 tablet (500 mg total) by mouth 2 (two) times daily. 01/28/15   Heather Laisure, PA-C   BP 98/65 mmHg  Pulse 98  Temp(Src) 98.5 F (36.9 C) (Oral)  Resp 16  SpO2 100%  LMP 03/21/2015 Physical Exam  Constitutional: She appears well-developed. No distress.  HENT:  Left Ear: External ear normal.  Mouth/Throat: No oropharyngeal exudate.  Mild oropharyngeal erythema  Eyes: Conjunctivae are normal. Pupils are equal, round, and reactive to light.  Neck: Neck supple.  Cardiovascular: Normal rate, regular rhythm and normal heart sounds.   No murmur heard. Pulmonary/Chest: Effort normal and breath sounds  normal.  Skin:     Nursing note and vitals reviewed.   ED Course  Procedures (including critical care time) Labs Review Labs Reviewed - No data to display  Imaging Review No results found.   MDM  No diagnosis found. Rash Headache Pharyngitis  Plan: Rash is likely resolving hand foot and mouth syndrome. 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 Topical steroid for itching and to help improve lesion. Return precaution discussed.  Headache : no sign of meningeal irritation.                    Currently asymptomatic.                     Tylenol prn  pain.  Pharyngitis: Rapid strep negative.                     Tylenol prn pain recommended.    Doreene Eland, MD 03/25/15 1739

## 2015-03-25 NOTE — Discharge Instructions (Signed)

## 2015-03-25 NOTE — ED Notes (Signed)
C/o rash all over body States she has seen discharge coming from areas Benadryl used as tx Rash does itch

## 2015-03-27 LAB — CULTURE, GROUP A STREP: Strep A Culture: POSITIVE — AB

## 2015-05-28 ENCOUNTER — Emergency Department (HOSPITAL_COMMUNITY): Payer: 59

## 2015-05-28 ENCOUNTER — Encounter (HOSPITAL_COMMUNITY): Payer: Self-pay | Admitting: Emergency Medicine

## 2015-05-28 ENCOUNTER — Emergency Department (HOSPITAL_COMMUNITY)
Admission: EM | Admit: 2015-05-28 | Discharge: 2015-05-28 | Disposition: A | Discharge status: Home / Self Care | Payer: 59 | Attending: Emergency Medicine | Admitting: Emergency Medicine

## 2015-05-28 DIAGNOSIS — R519 Headache, unspecified: Secondary | ICD-10-CM

## 2015-05-28 DIAGNOSIS — Z862 Personal history of diseases of the blood and blood-forming organs and certain disorders involving the immune mechanism: Secondary | ICD-10-CM | POA: Diagnosis not present

## 2015-05-28 DIAGNOSIS — Z3202 Encounter for pregnancy test, result negative: Secondary | ICD-10-CM | POA: Diagnosis not present

## 2015-05-28 DIAGNOSIS — Z8659 Personal history of other mental and behavioral disorders: Secondary | ICD-10-CM | POA: Diagnosis not present

## 2015-05-28 DIAGNOSIS — Z8669 Personal history of other diseases of the nervous system and sense organs: Secondary | ICD-10-CM | POA: Diagnosis not present

## 2015-05-28 DIAGNOSIS — Z8719 Personal history of other diseases of the digestive system: Secondary | ICD-10-CM | POA: Insufficient documentation

## 2015-05-28 DIAGNOSIS — J020 Streptococcal pharyngitis: Secondary | ICD-10-CM | POA: Diagnosis not present

## 2015-05-28 DIAGNOSIS — Z7952 Long term (current) use of systemic steroids: Secondary | ICD-10-CM | POA: Insufficient documentation

## 2015-05-28 DIAGNOSIS — Y998 Other external cause status: Secondary | ICD-10-CM | POA: Diagnosis not present

## 2015-05-28 DIAGNOSIS — S0990XA Unspecified injury of head, initial encounter: Secondary | ICD-10-CM | POA: Diagnosis present

## 2015-05-28 DIAGNOSIS — R51 Headache: Secondary | ICD-10-CM

## 2015-05-28 DIAGNOSIS — Z791 Long term (current) use of non-steroidal anti-inflammatories (NSAID): Secondary | ICD-10-CM | POA: Insufficient documentation

## 2015-05-28 DIAGNOSIS — Z720 Tobacco use: Secondary | ICD-10-CM | POA: Diagnosis not present

## 2015-05-28 DIAGNOSIS — Z79899 Other long term (current) drug therapy: Secondary | ICD-10-CM | POA: Diagnosis not present

## 2015-05-28 DIAGNOSIS — Y92410 Unspecified street and highway as the place of occurrence of the external cause: Secondary | ICD-10-CM | POA: Insufficient documentation

## 2015-05-28 DIAGNOSIS — Y9389 Activity, other specified: Secondary | ICD-10-CM | POA: Insufficient documentation

## 2015-05-28 DIAGNOSIS — R21 Rash and other nonspecific skin eruption: Secondary | ICD-10-CM | POA: Diagnosis not present

## 2015-05-28 LAB — I-STAT CHEM 8, ED
BUN: 8 mg/dL (ref 6–20)
Calcium, Ion: 1.15 mmol/L (ref 1.12–1.23)
Chloride: 105 mmol/L (ref 101–111)
Creatinine, Ser: 0.7 mg/dL (ref 0.44–1.00)
Glucose, Bld: 85 mg/dL (ref 65–99)
HCT: 38 % (ref 36.0–46.0)
HEMOGLOBIN: 12.9 g/dL (ref 12.0–15.0)
Potassium: 4.7 mmol/L (ref 3.5–5.1)
Sodium: 140 mmol/L (ref 135–145)
TCO2: 22 mmol/L (ref 0–100)

## 2015-05-28 LAB — I-STAT BETA HCG BLOOD, ED (MC, WL, AP ONLY): I-stat hCG, quantitative: <5 m[IU]/mL (ref <5)

## 2015-05-28 MED ORDER — CYCLOBENZAPRINE HCL 10 MG PO TABS: 10.0000 mg | ORAL_TABLET | Freq: Two times a day (BID) | ORAL | Status: DC | PRN

## 2015-05-28 MED ORDER — HYDROCODONE-ACETAMINOPHEN 5-325 MG PO TABS: 1.0000 | ORAL_TABLET | Freq: Four times a day (QID) | ORAL | Status: DC | PRN

## 2015-05-28 MED ORDER — PENICILLIN G BENZATHINE 1200000 UNIT/2ML IM SUSP
1.2000 10*6.[IU] | Freq: Once | INTRAMUSCULAR | Status: AC
  Administered 2015-05-28: 1.2 10*6.[IU] via INTRAMUSCULAR
  Filled 2015-05-28: qty 2

## 2015-05-28 MED ORDER — AMOXICILLIN 500 MG PO CAPS: 500.0000 mg | ORAL_CAPSULE | Freq: Three times a day (TID) | ORAL | Status: DC

## 2015-05-28 MED ORDER — NAPROXEN 375 MG PO TABS
375.0000 mg | ORAL_TABLET | Freq: Two times a day (BID) | ORAL | Status: DC
Start: 2015-05-28 — End: 2018-01-05

## 2015-05-28 MED ORDER — FLUORESCEIN SODIUM 1 MG OP STRP
1.0000 | ORAL_STRIP | Freq: Once | OPHTHALMIC | Status: DC
  Filled 2015-05-28: qty 1

## 2015-05-28 MED ORDER — DEXAMETHASONE SODIUM PHOSPHATE 10 MG/ML IJ SOLN
10.0000 mg | Freq: Once | INTRAMUSCULAR | Status: AC
  Administered 2015-05-28: 10 mg via INTRAMUSCULAR
  Filled 2015-05-28: qty 1

## 2015-05-28 MED ORDER — IBUPROFEN 800 MG PO TABS
800.0000 mg | ORAL_TABLET | Freq: Once | ORAL | Status: AC
  Administered 2015-05-28: 800 mg via ORAL
  Filled 2015-05-28: qty 1
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
## 2015-05-28 NOTE — ED Notes (Signed)
Bed: WTR6 Expected date:  Expected time:  Means of arrival:  Comments: 

## 2015-05-29 LAB — RPR: RPR: REACTIVE — AB

## 2015-05-29 LAB — RPR, QUANT+TP ABS (REFLEX)
Rapid Plasma Reagin, Quant: 1:256 {titer} — ABNORMAL HIGH
TREPONEMA PALLIDUM AB: POSITIVE — AB

## 2015-05-30 ENCOUNTER — Encounter: Payer: Self-pay | Admitting: Internal Medicine

## 2015-05-30 ENCOUNTER — Telehealth (HOSPITAL_BASED_OUTPATIENT_CLINIC_OR_DEPARTMENT_OTHER): Payer: Self-pay | Admitting: *Deleted

## 2015-06-03 ENCOUNTER — Telehealth: Payer: Self-pay | Admitting: Internal Medicine

## 2015-06-03 NOTE — Telephone Encounter (Signed)
Called patient to discuss test results from recent ED visit. Patient states she has already received a phone call from ED provider giving an explanation of results. Patient was treated for Syphilis in the ED. Patient inquired when should could have has sexual intercourse again. I will send a note to the nurse to call the patient tomorrow instructing her to wait at least 7 days after antibiotic treatment and wait to have sex until any sores or rashes have healed completely. It will also be important for her partner and any recent sexual contacts to be treated for syphilis. Recheck RPR in 6 months.

## 2015-06-04 ENCOUNTER — Telehealth: Payer: Self-pay | Admitting: *Deleted

## 2015-06-04 NOTE — Telephone Encounter (Signed)
Pt called - no answer; left message for pt to call [facility]. 

## 2015-06-04 NOTE — Telephone Encounter (Signed)
-----   Message from Aldean Baker, MD sent at 06/03/2015  9:48 PM EDT ----- Regarding: Patient Advice Glenda, do you mind calling this patient back and giving her the following advice? Patient inquired when should could have has sexual intercourse again after she was treated for syphilis.   Will you please let her know to wait at least 7 days after antibiotic treatment and wait to have sex until any sores or rashes have healed completely. It will also be important for her partner and any recent sexual contacts to be treated for syphilis.   Thank you, [name]

## 2015-06-05 NOTE — Telephone Encounter (Signed)
Called pt again; no answer - left message to call [facility] @ [phone].

## 2015-07-01 ENCOUNTER — Ambulatory Visit: Payer: 59 | Admitting: Internal Medicine

## 2015-07-01 ENCOUNTER — Encounter: Payer: Self-pay | Admitting: General Practice

## 2017-01-18 ENCOUNTER — Ambulatory Visit (INDEPENDENT_AMBULATORY_CARE_PROVIDER_SITE_OTHER): Payer: 59 | Admitting: Licensed Clinical Social Worker

## 2017-01-18 ENCOUNTER — Encounter (HOSPITAL_COMMUNITY): Payer: Self-pay | Admitting: Licensed Clinical Social Worker

## 2017-01-18 DIAGNOSIS — F411 Generalized anxiety disorder: Secondary | ICD-10-CM

## 2017-01-18 DIAGNOSIS — F331 Major depressive disorder, recurrent, moderate: Secondary | ICD-10-CM
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
## 2017-02-03 ENCOUNTER — Encounter (HOSPITAL_COMMUNITY): Payer: Self-pay | Admitting: Licensed Clinical Social Worker

## 2017-02-03 ENCOUNTER — Ambulatory Visit (INDEPENDENT_AMBULATORY_CARE_PROVIDER_SITE_OTHER): Payer: 59 | Admitting: Licensed Clinical Social Worker

## 2017-02-03 DIAGNOSIS — F411 Generalized anxiety disorder: Secondary | ICD-10-CM | POA: Diagnosis not present

## 2017-02-03 DIAGNOSIS — F331 Major depressive disorder, recurrent, moderate: Secondary | ICD-10-CM

## 2017-02-03 NOTE — Progress Notes (Signed)
   THERAPIST PROGRESS NOTE  Session Time: 3:10-4pm  Participation Level: Active  Behavioral Response: NeatAlertAnxious  Type of Therapy: Individual Therapy  Treatment Goals addressed: Anxiety and Coping  Interventions: CBT  Summary: Patricia Holloway is a 36 y.o. female. Pt discussed her psychiatric symtpms and current life events. Pt is signing her divorce papers and delivering them to [employer] Friday. Since sisters did an intervention their relationship is strained. They are beginning to work on their relationship. Pt is having problem sleeping. Suggested to pt to use guided meditation for sleep. Pt was receptive to the idea. Had pt listen to some guided meditations that resonates with her. Custody is still pending with her children. She pays her x husband $600 for her 3 sons. She has custody of the 2 girls, 1 is which not her xhusband's child. This is her biggest stressor. Processed stress with pt and worked with pt to help her formulate healthier coping techniques. Taught pt new grounding techniques and gave her a handout on mindullness breathing. Pt was receptive to mindfulness practice.  Suicidal/Homicidal: Nowithout intent/plan  Therapist Response: Assessed pt's current functioning and reviewed progress. Assisted pt processing relationships, transition, mindfulness practices and processing for the management of her stressors.  Plan: Return again in 2 weeks.  Diagnosis: Axis I: MDD, current episode moderate        Rael Tilly S, LCAS 02/03/2017

## 2017-03-01 ENCOUNTER — Ambulatory Visit (HOSPITAL_COMMUNITY): Payer: 59 | Admitting: Licensed Clinical Social Worker

## 2017-03-16 ENCOUNTER — Ambulatory Visit (HOSPITAL_COMMUNITY): Payer: Self-pay | Admitting: Licensed Clinical Social Worker

## 2017-03-17 ENCOUNTER — Ambulatory Visit (HOSPITAL_COMMUNITY): Payer: Self-pay | Admitting: Psychiatry

## 2017-03-30 ENCOUNTER — Ambulatory Visit (HOSPITAL_COMMUNITY): Payer: Self-pay | Admitting: Licensed Clinical Social Worker

## 2018-01-05 ENCOUNTER — Encounter (HOSPITAL_COMMUNITY): Payer: Self-pay | Admitting: *Deleted

## 2018-01-05 ENCOUNTER — Other Ambulatory Visit: Payer: Self-pay

## 2018-01-05 ENCOUNTER — Emergency Department (HOSPITAL_COMMUNITY)
Admission: EM | Admit: 2018-01-05 | Discharge: 2018-01-05 | Disposition: A | Discharge status: Home / Self Care | Payer: 59 | Attending: Emergency Medicine | Admitting: Emergency Medicine

## 2018-01-05 DIAGNOSIS — S161XXA Strain of muscle, fascia and tendon at neck level, initial encounter: Secondary | ICD-10-CM | POA: Insufficient documentation

## 2018-01-05 DIAGNOSIS — Y939 Activity, unspecified: Secondary | ICD-10-CM | POA: Insufficient documentation

## 2018-01-05 DIAGNOSIS — F17200 Nicotine dependence, unspecified, uncomplicated: Secondary | ICD-10-CM | POA: Insufficient documentation

## 2018-01-05 DIAGNOSIS — Y929 Unspecified place or not applicable: Secondary | ICD-10-CM | POA: Insufficient documentation

## 2018-01-05 DIAGNOSIS — Y999 Unspecified external cause status: Secondary | ICD-10-CM | POA: Insufficient documentation

## 2018-01-05 DIAGNOSIS — Z79899 Other long term (current) drug therapy: Secondary | ICD-10-CM | POA: Insufficient documentation

## 2018-01-05 MED ORDER — CYCLOBENZAPRINE HCL 5 MG PO TABS: 5.0000 mg | ORAL_TABLET | Freq: Every evening | ORAL | 0 refills | Status: DC | PRN

## 2018-01-05 MED ORDER — NAPROXEN 500 MG PO TABS: 500.0000 mg | ORAL_TABLET | Freq: Two times a day (BID) | ORAL | 0 refills | Status: DC

## 2018-01-05 NOTE — Discharge Instructions (Signed)
Take naproxen 2 times a day with meals.  Do not take other anti-inflammatories at the same time open (Advil, Motrin, ibuprofen, Aleve). You may supplement with Tylenol if you need further pain control. Use Flexeril as needed for muscle stiffness or soreness.  Have caution, this may make you tired or groggy.  Do not drive or operate heavy machinery while taking this medicine. Use heating pads if this helps control your pain. You will likely have continued muscle stiffness and soreness over the next couple days.  Follow-up in 1 week if your symptoms are not improving. Return to the emergency room if you develop vision changes, vomiting, slurred speech, numbness, loss of bowel or bladder control, or any new or worsening symptoms.
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
## 2018-09-27 ENCOUNTER — Observation Stay (HOSPITAL_COMMUNITY)
Admission: EM | Admit: 2018-09-27 | Discharge: 2018-09-29 | Disposition: A | Discharge status: Home / Self Care | Payer: 59 | Attending: Surgery | Admitting: Surgery

## 2018-09-27 ENCOUNTER — Observation Stay (HOSPITAL_COMMUNITY): Payer: 59 | Admitting: Anesthesiology

## 2018-09-27 ENCOUNTER — Other Ambulatory Visit: Payer: Self-pay

## 2018-09-27 ENCOUNTER — Encounter (HOSPITAL_COMMUNITY): Payer: Self-pay | Admitting: Internal Medicine

## 2018-09-27 ENCOUNTER — Emergency Department (HOSPITAL_COMMUNITY): Payer: 59

## 2018-09-27 ENCOUNTER — Encounter (HOSPITAL_COMMUNITY)
Admission: EM | Disposition: A | Discharge status: Home / Self Care | Payer: Self-pay | Source: Home / Self Care | Attending: Emergency Medicine

## 2018-09-27 DIAGNOSIS — R12 Heartburn: Secondary | ICD-10-CM | POA: Diagnosis not present

## 2018-09-27 DIAGNOSIS — F17210 Nicotine dependence, cigarettes, uncomplicated: Secondary | ICD-10-CM | POA: Insufficient documentation

## 2018-09-27 DIAGNOSIS — D573 Sickle-cell trait: Secondary | ICD-10-CM | POA: Insufficient documentation

## 2018-09-27 DIAGNOSIS — Z79899 Other long term (current) drug therapy: Secondary | ICD-10-CM | POA: Diagnosis not present

## 2018-09-27 DIAGNOSIS — K8020 Calculus of gallbladder without cholecystitis without obstruction: Secondary | ICD-10-CM | POA: Diagnosis present

## 2018-09-27 DIAGNOSIS — K8080 Other cholelithiasis without obstruction: Secondary | ICD-10-CM

## 2018-09-27 DIAGNOSIS — K8010 Calculus of gallbladder with chronic cholecystitis without obstruction: Principal | ICD-10-CM | POA: Insufficient documentation

## 2018-09-27 HISTORY — PX: LAPAROSCOPIC CHOLECYSTECTOMY: SUR755

## 2018-09-27 HISTORY — PX: CHOLECYSTECTOMY: SHX55

## 2018-09-27 LAB — CBC WITH DIFFERENTIAL/PLATELET
Abs Immature Granulocytes: 0.01 10*3/uL (ref 0.00–0.07)
Basophils Absolute: 0 10*3/uL (ref 0.0–0.1)
Basophils Relative: 0 %
EOS PCT: 2 %
Eosinophils Absolute: 0.1 10*3/uL (ref 0.0–0.5)
HEMATOCRIT: 39.8 % (ref 36.0–46.0)
HEMOGLOBIN: 12.3 g/dL (ref 12.0–15.0)
Immature Granulocytes: 0 %
LYMPHS ABS: 1.2 10*3/uL (ref 0.7–4.0)
Lymphocytes Relative: 22 %
MCH: 29.5 pg (ref 26.0–34.0)
MCHC: 30.9 g/dL (ref 30.0–36.0)
MCV: 95.4 fL (ref 80.0–100.0)
Monocytes Absolute: 0.4 10*3/uL (ref 0.1–1.0)
Monocytes Relative: 8 %
Neutro Abs: 3.7 10*3/uL (ref 1.7–7.7)
Neutrophils Relative %: 68 %
Platelets: 278 10*3/uL (ref 150–400)
RBC: 4.17 MIL/uL (ref 3.87–5.11)
RDW: 14.6 % (ref 11.5–15.5)
WBC: 5.5 10*3/uL (ref 4.0–10.5)
nRBC: 0 % (ref 0.0–0.2)

## 2018-09-27 LAB — COMPREHENSIVE METABOLIC PANEL
ALK PHOS: 66 U/L (ref 38–126)
ALT: 17 U/L (ref 0–44)
AST: 34 U/L (ref 15–41)
Albumin: 4.2 g/dL (ref 3.5–5.0)
Anion gap: 11 (ref 5–15)
BUN: 8 mg/dL (ref 6–20)
CO2: 21 mmol/L — ABNORMAL LOW (ref 22–32)
Calcium: 9.2 mg/dL (ref 8.9–10.3)
Chloride: 106 mmol/L (ref 98–111)
Creatinine, Ser: 0.91 mg/dL (ref 0.44–1.00)
GFR calc Af Amer: >60 mL/min (ref >60)
GFR calc non Af Amer: >60 mL/min (ref >60)
Glucose, Bld: 114 mg/dL — ABNORMAL HIGH (ref 70–99)
POTASSIUM: 4.3 mmol/L (ref 3.5–5.1)
Sodium: 138 mmol/L (ref 135–145)
TOTAL PROTEIN: 7.3 g/dL (ref 6.5–8.1)
Total Bilirubin: 1.2 mg/dL (ref 0.3–1.2)

## 2018-09-27 LAB — I-STAT BETA HCG BLOOD, ED (MC, WL, AP ONLY): I-stat hCG, quantitative: <5 m[IU]/mL (ref <5)

## 2018-09-27 LAB — LIPASE, BLOOD: Lipase: 28 U/L (ref 11–51)

## 2018-09-27 SURGERY — LAPAROSCOPIC CHOLECYSTECTOMY
Anesthesia: General | Site: Abdomen

## 2018-09-27 MED ORDER — FENTANYL CITRATE (PF) 100 MCG/2ML IJ SOLN
50.0000 ug | Freq: Once | INTRAMUSCULAR | Status: AC
  Administered 2018-09-27: 50 ug via INTRAVENOUS
  Filled 2018-09-27: qty 1

## 2018-09-27 MED ORDER — LIDOCAINE 2% (20 MG/ML) 5 ML SYRINGE
INTRAMUSCULAR | Status: AC
  Filled 2018-09-27: qty 5

## 2018-09-27 MED ORDER — MIDAZOLAM HCL 5 MG/5ML IJ SOLN
INTRAMUSCULAR | Status: DC | PRN
  Administered 2018-09-27: 2 mg via INTRAVENOUS

## 2018-09-27 MED ORDER — HYDROMORPHONE HCL 1 MG/ML IJ SOLN
INTRAMUSCULAR | Status: AC
  Filled 2018-09-27: qty 1

## 2018-09-27 MED ORDER — ACETAMINOPHEN 10 MG/ML IV SOLN
INTRAVENOUS | Status: DC | PRN
  Administered 2018-09-27: 1000 mg via INTRAVENOUS

## 2018-09-27 MED ORDER — ACETAMINOPHEN 325 MG PO TABS
650.0000 mg | ORAL_TABLET | Freq: Four times a day (QID) | ORAL | Status: DC
  Administered 2018-09-27 – 2018-09-29 (×6): 650 mg via ORAL
  Filled 2018-09-27 (×6): qty 2

## 2018-09-27 MED ORDER — MORPHINE SULFATE (PF) 4 MG/ML IV SOLN
4.0000 mg | Freq: Once | INTRAVENOUS | Status: AC
  Administered 2018-09-27: 4 mg via INTRAVENOUS
  Filled 2018-09-27: qty 1

## 2018-09-27 MED ORDER — ROCURONIUM BROMIDE 10 MG/ML (PF) SYRINGE
PREFILLED_SYRINGE | INTRAVENOUS | Status: DC | PRN
  Administered 2018-09-27: 20 mg via INTRAVENOUS
  Administered 2018-09-27: 50 mg via INTRAVENOUS

## 2018-09-27 MED ORDER — MEPERIDINE HCL 50 MG/ML IJ SOLN: 6.2500 mg | INTRAMUSCULAR | Status: DC | PRN

## 2018-09-27 MED ORDER — ONDANSETRON HCL 4 MG/2ML IJ SOLN
INTRAMUSCULAR | Status: DC | PRN
  Administered 2018-09-27: 4 mg via INTRAVENOUS

## 2018-09-27 MED ORDER — DEXMEDETOMIDINE HCL 200 MCG/2ML IV SOLN
INTRAVENOUS | Status: DC | PRN
  Administered 2018-09-27: 12 ug via INTRAVENOUS
  Administered 2018-09-27: 8 ug via INTRAVENOUS

## 2018-09-27 MED ORDER — ONDANSETRON HCL 4 MG/2ML IJ SOLN: 4.0000 mg | Freq: Once | INTRAMUSCULAR | Status: DC | PRN

## 2018-09-27 MED ORDER — IOHEXOL 300 MG/ML  SOLN: 100.0000 mL | Freq: Once | INTRAMUSCULAR | Status: DC

## 2018-09-27 MED ORDER — BUPIVACAINE-EPINEPHRINE (PF) 0.25% -1:200000 IJ SOLN
INTRAMUSCULAR | Status: AC
  Filled 2018-09-27: qty 30

## 2018-09-27 MED ORDER — HYDROMORPHONE HCL 1 MG/ML IJ SOLN: 1.0000 mg | INTRAMUSCULAR | Status: DC | PRN

## 2018-09-27 MED ORDER — HYDROMORPHONE HCL 1 MG/ML IJ SOLN
0.5000 mg | Freq: Once | INTRAMUSCULAR | Status: AC | PRN
  Administered 2018-09-27: 0.5 mg via INTRAVENOUS
  Filled 2018-09-27: qty 1

## 2018-09-27 MED ORDER — DIPHENHYDRAMINE HCL 25 MG PO CAPS
25.0000 mg | ORAL_CAPSULE | Freq: Four times a day (QID) | ORAL | Status: DC | PRN
  Administered 2018-09-28: 25 mg via ORAL
  Filled 2018-09-27: qty 1

## 2018-09-27 MED ORDER — ONDANSETRON HCL 4 MG/2ML IJ SOLN: 4.0000 mg | Freq: Four times a day (QID) | INTRAMUSCULAR | Status: DC | PRN

## 2018-09-27 MED ORDER — BUPIVACAINE-EPINEPHRINE 0.25% -1:200000 IJ SOLN
INTRAMUSCULAR | Status: DC | PRN
  Administered 2018-09-27: 30 mL

## 2018-09-27 MED ORDER — IOPAMIDOL (ISOVUE-300) INJECTION 61%
INTRAVENOUS | Status: AC
  Filled 2018-09-27: qty 50

## 2018-09-27 MED ORDER — ONDANSETRON HCL 4 MG/2ML IJ SOLN
INTRAMUSCULAR | Status: AC
  Filled 2018-09-27: qty 2

## 2018-09-27 MED ORDER — HYDROMORPHONE HCL 1 MG/ML IJ SOLN: 0.5000 mg | Freq: Once | INTRAMUSCULAR | Status: DC

## 2018-09-27 MED ORDER — SODIUM CHLORIDE 0.9 % IV SOLN
2.0000 g | INTRAVENOUS | Status: DC
  Administered 2018-09-27: 2 g via INTRAVENOUS
  Filled 2018-09-27: qty 20

## 2018-09-27 MED ORDER — OXYCODONE HCL 5 MG PO TABS
5.0000 mg | ORAL_TABLET | ORAL | Status: DC | PRN
  Administered 2018-09-27 – 2018-09-29 (×8): 5 mg via ORAL
  Filled 2018-09-27 (×8): qty 1

## 2018-09-27 MED ORDER — ONDANSETRON HCL 4 MG/2ML IJ SOLN
4.0000 mg | Freq: Once | INTRAMUSCULAR | Status: AC
  Administered 2018-09-27: 4 mg via INTRAVENOUS
  Filled 2018-09-27: qty 2

## 2018-09-27 MED ORDER — HYDROMORPHONE HCL 1 MG/ML IJ SOLN
1.0000 mg | Freq: Once | INTRAMUSCULAR | Status: AC
  Administered 2018-09-27: 1 mg via INTRAVENOUS
  Filled 2018-09-27: qty 1

## 2018-09-27 MED ORDER — HYDROMORPHONE HCL 1 MG/ML IJ SOLN
1.0000 mg | INTRAMUSCULAR | Status: DC | PRN
  Administered 2018-09-27 – 2018-09-28 (×3): 1 mg via INTRAVENOUS
  Filled 2018-09-27 (×3): qty 1

## 2018-09-27 MED ORDER — DEXAMETHASONE SODIUM PHOSPHATE 10 MG/ML IJ SOLN
INTRAMUSCULAR | Status: AC
  Filled 2018-09-27: qty 1

## 2018-09-27 MED ORDER — 0.9 % SODIUM CHLORIDE (POUR BTL) OPTIME
TOPICAL | Status: DC | PRN
  Administered 2018-09-27: 1000 mL

## 2018-09-27 MED ORDER — POTASSIUM CHLORIDE IN NACL 20-0.9 MEQ/L-% IV SOLN
INTRAVENOUS | Status: DC
  Administered 2018-09-27 – 2018-09-28 (×2): via INTRAVENOUS
  Filled 2018-09-27 (×3): qty 1000

## 2018-09-27 MED ORDER — PROPOFOL 10 MG/ML IV BOLUS
INTRAVENOUS | Status: DC | PRN
  Administered 2018-09-27: 150 mg via INTRAVENOUS

## 2018-09-27 MED ORDER — FENTANYL CITRATE (PF) 100 MCG/2ML IJ SOLN
INTRAMUSCULAR | Status: DC | PRN
  Administered 2018-09-27: 150 ug via INTRAVENOUS
  Administered 2018-09-27 (×2): 50 ug via INTRAVENOUS

## 2018-09-27 MED ORDER — SODIUM CHLORIDE 0.9 % IR SOLN
Status: DC | PRN
  Administered 2018-09-27: 1000 mL

## 2018-09-27 MED ORDER — ROCURONIUM BROMIDE 50 MG/5ML IV SOSY
PREFILLED_SYRINGE | INTRAVENOUS | Status: AC
  Filled 2018-09-27: qty 5

## 2018-09-27 MED ORDER — POTASSIUM CHLORIDE IN NACL 20-0.9 MEQ/L-% IV SOLN
INTRAVENOUS | Status: DC
  Filled 2018-09-27: qty 1000

## 2018-09-27 MED ORDER — PROPOFOL 10 MG/ML IV BOLUS
INTRAVENOUS | Status: AC
  Filled 2018-09-27: qty 20

## 2018-09-27 MED ORDER — LIDOCAINE 2% (20 MG/ML) 5 ML SYRINGE
INTRAMUSCULAR | Status: DC | PRN
  Administered 2018-09-27: 100 mg via INTRAVENOUS

## 2018-09-27 MED ORDER — HYDROMORPHONE HCL 1 MG/ML IJ SOLN
0.2500 mg | INTRAMUSCULAR | Status: DC | PRN
  Administered 2018-09-27 (×4): 0.25 mg via INTRAVENOUS

## 2018-09-27 MED ORDER — LACTATED RINGERS IV SOLN
INTRAVENOUS | Status: DC | PRN
  Administered 2018-09-27: 15:00:00 via INTRAVENOUS

## 2018-09-27 MED ORDER — FENTANYL CITRATE (PF) 100 MCG/2ML IJ SOLN
INTRAMUSCULAR | Status: AC
  Filled 2018-09-27: qty 2

## 2018-09-27 MED ORDER — IOPAMIDOL (ISOVUE-300) INJECTION 61%
100.0000 mL | Freq: Once | INTRAVENOUS | Status: AC | PRN
  Administered 2018-09-27: 100 mL via INTRAVENOUS

## 2018-09-27 MED ORDER — MIDAZOLAM HCL 2 MG/2ML IJ SOLN
INTRAMUSCULAR | Status: AC
  Filled 2018-09-27: qty 2

## 2018-09-27 MED ORDER — PHENYLEPHRINE 40 MCG/ML (10ML) SYRINGE FOR IV PUSH (FOR BLOOD PRESSURE SUPPORT)
PREFILLED_SYRINGE | INTRAVENOUS | Status: DC | PRN
  Administered 2018-09-27: 80 ug via INTRAVENOUS
  Administered 2018-09-27: 120 ug via INTRAVENOUS

## 2018-09-27 MED ORDER — ONDANSETRON 4 MG PO TBDP: 4.0000 mg | ORAL_TABLET | Freq: Four times a day (QID) | ORAL | Status: DC | PRN

## 2018-09-27 MED ORDER — KETOROLAC TROMETHAMINE 30 MG/ML IJ SOLN: 30.0000 mg | Freq: Once | INTRAMUSCULAR | Status: DC

## 2018-09-27 MED ORDER — FENTANYL CITRATE (PF) 250 MCG/5ML IJ SOLN
INTRAMUSCULAR | Status: AC
  Filled 2018-09-27: qty 5

## 2018-09-27 MED ORDER — LACTATED RINGERS IV SOLN
INTRAVENOUS | Status: DC
  Administered 2018-09-27: 13:00:00 via INTRAVENOUS

## 2018-09-27 MED ORDER — ACETAMINOPHEN 10 MG/ML IV SOLN
INTRAVENOUS | Status: AC
  Filled 2018-09-27: qty 100

## 2018-09-27 MED ORDER — DEXAMETHASONE SODIUM PHOSPHATE 10 MG/ML IJ SOLN
INTRAMUSCULAR | Status: DC | PRN
  Administered 2018-09-27: 10 mg via INTRAVENOUS

## 2018-09-27 MED ORDER — DIPHENHYDRAMINE HCL 50 MG/ML IJ SOLN: 25.0000 mg | Freq: Four times a day (QID) | INTRAMUSCULAR | Status: DC | PRN

## 2018-09-27 SURGICAL SUPPLY — 38 items
APPLIER CLIP 5 13 M/L LIGAMAX5 (MISCELLANEOUS) ×3
BLADE CLIPPER SURG (BLADE) IMPLANT
CANISTER SUCT 3000ML PPV (MISCELLANEOUS) ×3 IMPLANT
CHLORAPREP W/TINT 26ML (MISCELLANEOUS) ×3 IMPLANT
CLIP APPLIE 5 13 M/L LIGAMAX5 (MISCELLANEOUS) ×1 IMPLANT
COVER SURGICAL LIGHT HANDLE (MISCELLANEOUS) ×3 IMPLANT
COVER WAND RF STERILE (DRAPES) ×3 IMPLANT
DERMABOND ADVANCED (GAUZE/BANDAGES/DRESSINGS) ×2
DERMABOND ADVANCED .7 DNX12 (GAUZE/BANDAGES/DRESSINGS) ×1 IMPLANT
ELECT REM PT RETURN 9FT ADLT (ELECTROSURGICAL) ×3
ELECTRODE REM PT RTRN 9FT ADLT (ELECTROSURGICAL) ×1 IMPLANT
GLOVE BIO SURGEON STRL SZ 6 (GLOVE) ×3 IMPLANT
GLOVE BIOGEL PI IND STRL 6.5 (GLOVE) IMPLANT
GLOVE BIOGEL PI INDICATOR 6.5 (GLOVE) ×2
GLOVE INDICATOR 6.5 STRL GRN (GLOVE) ×3 IMPLANT
GLOVE SURG SS PI 6.5 STRL IVOR (GLOVE) ×2 IMPLANT
GOWN STRL REUS W/ TWL LRG LVL3 (GOWN DISPOSABLE) ×3 IMPLANT
GOWN STRL REUS W/TWL LRG LVL3 (GOWN DISPOSABLE) ×6
GRASPER SUT TROCAR 14GX15 (MISCELLANEOUS) ×3 IMPLANT
KIT BASIN OR (CUSTOM PROCEDURE TRAY) ×3 IMPLANT
KIT TURNOVER KIT B (KITS) ×3 IMPLANT
NDL INSUFFLATION 14GA 120MM (NEEDLE) ×1 IMPLANT
NEEDLE INSUFFLATION 14GA 120MM (NEEDLE) ×3 IMPLANT
NS IRRIG 1000ML POUR BTL (IV SOLUTION) ×3 IMPLANT
PAD ARMBOARD 7.5X6 YLW CONV (MISCELLANEOUS) ×3 IMPLANT
POUCH SPECIMEN RETRIEVAL 10MM (ENDOMECHANICALS) ×3 IMPLANT
SCISSORS LAP 5X35 DISP (ENDOMECHANICALS) ×3 IMPLANT
SET IRRIG TUBING LAPAROSCOPIC (IRRIGATION / IRRIGATOR) ×3 IMPLANT
SET TUBE SMOKE EVAC HIGH FLOW (TUBING) ×3 IMPLANT
SLEEVE ENDOPATH XCEL 5M (ENDOMECHANICALS) ×8 IMPLANT
SPECIMEN JAR SMALL (MISCELLANEOUS) ×3 IMPLANT
SUT MNCRL AB 4-0 PS2 18 (SUTURE) ×5 IMPLANT
TOWEL OR 17X24 6PK STRL BLUE (TOWEL DISPOSABLE) ×3 IMPLANT
TOWEL OR 17X26 10 PK STRL BLUE (TOWEL DISPOSABLE) IMPLANT
TRAY LAPAROSCOPIC MC (CUSTOM PROCEDURE TRAY) ×3 IMPLANT
TROCAR XCEL NON-BLD 11X100MML (ENDOMECHANICALS) ×3 IMPLANT
TROCAR XCEL NON-BLD 5MMX100MML (ENDOMECHANICALS) ×3 IMPLANT
WATER STERILE IRR 1000ML POUR (IV SOLUTION) ×3 IMPLANT

## 2018-09-27 NOTE — ED Notes (Signed)
ED Provider at bedside. 

## 2018-09-27 NOTE — ED Notes (Signed)
Patient transported to CT 

## 2018-09-27 NOTE — Anesthesia Procedure Notes (Signed)
Procedure Name: Intubation Performed by: Milford Cage, CRNA Pre-anesthesia Checklist: Patient identified, Emergency Drugs available, Suction available and Patient being monitored Patient Re-evaluated:Patient Re-evaluated prior to induction Oxygen Delivery Method: Circle System Utilized Preoxygenation: Pre-oxygenation with 100% oxygen Induction Type: IV induction Ventilation: Mask ventilation without difficulty and Oral airway inserted - appropriate to patient size Laryngoscope Size: Mac and 4 Grade View: Grade II Tube type: Oral Tube size: 7.0 mm Number of attempts: 1 Airway Equipment and Method: Oral airway and Bougie stylet Placement Confirmation: ETT inserted through vocal cords under direct vision,  positive ETCO2 and breath sounds checked- equal and bilateral Secured at: 23 cm Tube secured with: Tape Dental Injury: Teeth and Oropharynx as per pre-operative assessment  Comments: G2b view with a MAC4- unable to maneuver tube into place. Bougie used easily

## 2018-09-27 NOTE — ED Notes (Signed)
Pt had one episode of emesis prior to zofran administration. Zofran administered at 0839 and cold wash cloths applied to patient's neck and forehead.

## 2018-09-27 NOTE — Plan of Care (Signed)
  Problem: Education: Goal: Knowledge of General Education information will improve Description Including pain rating scale, medication(s)/side effects and non-pharmacologic comfort measures Outcome: Progressing   Problem: Health Behavior/Discharge Planning: Goal: Ability to manage health-related needs will improve Outcome: Progressing   Problem: Clinical Measurements: Goal: Ability to maintain clinical measurements within normal limits will improve Outcome: Progressing Goal: Will remain free from infection Outcome: Progressing   Problem: Activity: Goal: Risk for activity intolerance will decrease Outcome: Progressing   Problem: Nutrition: Goal: Adequate nutrition will be maintained Outcome: Progressing   Problem: Coping: Goal: Level of anxiety will decrease Outcome: Progressing   Problem: Elimination: Goal: Will not experience complications related to urinary retention Outcome: Progressing   Problem: Pain Managment: Goal: General experience of comfort will improve Outcome: Progressing   Problem: Safety: Goal: Ability to remain free from injury will improve Outcome: Progressing   Problem: Skin Integrity: Goal: Risk for impaired skin integrity will decrease Outcome: Progressing   
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
## 2018-09-27 NOTE — Transfer of Care (Signed)
Immediate Anesthesia Transfer of Care Note  Patient: Patricia Holloway  Procedure(s) Performed: LAPAROSCOPIC CHOLECYSTECTOMY (N/A Abdomen)  Patient Location: PACU  Anesthesia Type:MAC  Level of Consciousness: drowsy  Airway & Oxygen Therapy: Patient Spontanous Breathing and Patient connected to nasal cannula oxygen  Post-op Assessment: Report given to RN and Post -op Vital signs reviewed and stable  Post vital signs: Reviewed and stable  Last Vitals:  Vitals Value Taken Time  BP 125/75 09/27/2018  4:55 PM  Temp    Pulse 68 09/27/2018  4:57 PM  Resp 14 09/27/2018  4:57 PM  SpO2 100 % 09/27/2018  4:57 PM  Vitals shown include unvalidated device data.  Last Pain:  Vitals:   09/27/18 1412  TempSrc:   PainSc: Asleep         Complications: No apparent anesthesia complications

## 2018-09-27 NOTE — Consult Note (Signed)
Reason for Consult: Abdominal pain with cholelithiasis Referring Physician: [name] Complaint sudden onset of right left upper quadrant abdominal pain that woke her while sleeping vomited x1  Patricia Holloway is an 38 y.o. female.   HPI: She is a 37 year old female who presented to the ED with the above-noted complaints.  She is treated with Zofran and seen by the emergency department staff.  She reports she has had these pains before but is never been this long and this bad.  It usually occurs after eating and she pretty much cut down to just vegetables and occasional chicken.  She had a hot dog yesterday and thinks this set off her spell.  Work-up shows she is afebrile vital signs were stable.  Labs shows a normal white count, hemoglobin 12.3, hematocrit 39.8, platelets 278,000.  LFTs are normal with a lipase of 28 AST 34 ALT 17 total bilirubin 1.2.  A CT of the abdomen was obtained which shows cholelithiasis and there is a 2.5 cm gallstone in the gallbladder neck.  There is no intrahepatic or extrahepatic biliary dilatation.  The else was remarkable on the CT sub-some disc disease L5-S1 area.  We are asked to see.  She is in the ED she sitting up crying with ongoing pain in her right upper quadrant.  Past Medical History:  Diagnosis Date  . Ectopic pregnancy   . Partial small bowel obstruction (HCC) 09/2014  . PONV (postoperative nausea and vomiting)   . Postpartum depression   . Sickle cell trait (HCC)   . Spinal headache     Past Surgical History:  Procedure Laterality Date  . CESAREAN SECTION N/A 04/06/2014   Procedure: REPEAT CESAREAN SECTION;  Surgeon: Essie Hart, MD;  Location: [facility];  Service: Obstetrics;  Laterality: N/A;  . ECTOPIC PREGNANCY SURGERY  2003  . UNILATERAL SALPINGECTOMY Right 2004    Family History  Problem Relation Age of Onset  . Heart disease Mother   . Sickle cell trait Father     Social History:  reports that she has been smoking. She has a 2.50  pack-year smoking history. She has never used smokeless tobacco. She reports current alcohol use of about 1.0 standard drinks of alcohol per week. She reports that she does not use drugs.  Allergies:  Allergies  Allergen Reactions  . Pork-Derived Products Other (See Comments)    stomach upset when she eats pork  Single mom with 5 children; 3 sons live with the father.  2 daughters are currently being cared for She does office work Tobacco less than a pack per day for 12 years EtOH: Social Drugs: None Prior to Admission medications   Medication Sig Start Date End Date Taking? Authorizing Provider  cetirizine (ZYRTEC) 10 MG tablet Take 10 mg by mouth as needed for allergies.   Yes [provider]  amoxicillin (AMOXIL) 500 MG capsule Take 1 capsule (500 mg total) by mouth 3 (three) times daily. Patient not taking: Reported on 09/27/2018 05/28/15   Arthor Captain, PA-C  cyclobenzaprine (FLEXERIL) 5 MG tablet Take 1 tablet (5 mg total) by mouth at bedtime as needed for muscle spasms. Patient not taking: Reported on 09/27/2018 01/05/18   Caccavale, Sophia, PA-C  HYDROcodone-acetaminophen (NORCO) 5-325 MG per tablet Take 1-2 tablets by mouth every 6 (six) hours as needed for moderate pain. Patient not taking: Reported on 09/27/2018 05/28/15   Arthor Captain, PA-C  hydrocortisone valerate cream (WESTCORT) 0.2 % Apply 1 application topically 2 (two) times daily.  Patient not taking: Reported on 09/27/2018 03/25/15   Doreene Eland, MD  naproxen (NAPROSYN) 500 MG tablet Take 1 tablet (500 mg total) by mouth 2 (two) times daily with a meal. Patient not taking: Reported on 09/27/2018 01/05/18   Alveria Apley, PA-C     Results for orders placed or performed during the hospital encounter of 09/27/18 (from the past 48 hour(s))  CBC with Differential     Status: None   Collection Time: 09/27/18  8:22 AM  Result Value Ref Range   WBC 5.5 4.0 - 10.5 K/uL   RBC 4.17 3.87 - 5.11 MIL/uL   Hemoglobin 12.3  12.0 - 15.0 g/dL   HCT 34.1 93.7 - 90.2 %   MCV 95.4 80.0 - 100.0 fL   MCH 29.5 26.0 - 34.0 pg   MCHC 30.9 30.0 - 36.0 g/dL   RDW 40.9 73.5 - 32.9 %   Platelets 278 150 - 400 K/uL   nRBC 0.0 0.0 - 0.2 %   Neutrophils Relative % 68 %   Neutro Abs 3.7 1.7 - 7.7 K/uL   Lymphocytes Relative 22 %   Lymphs Abs 1.2 0.7 - 4.0 K/uL   Monocytes Relative 8 %   Monocytes Absolute 0.4 0.1 - 1.0 K/uL   Eosinophils Relative 2 %   Eosinophils Absolute 0.1 0.0 - 0.5 K/uL   Basophils Relative 0 %   Basophils Absolute 0.0 0.0 - 0.1 K/uL   Immature Granulocytes 0 %   Abs Immature Granulocytes 0.01 0.00 - 0.07 K/uL    Comment: Performed at [facility], [address], [place], [place] [address]  Comprehensive metabolic panel     Status: Abnormal   Collection Time: 09/27/18  8:22 AM  Result Value Ref Range   Sodium 138 135 - 145 mmol/L   Potassium 4.3 3.5 - 5.1 mmol/L   Chloride 106 98 - 111 mmol/L   CO2 21 (L) 22 - 32 mmol/L   Glucose, Bld 114 (H) 70 - 99 mg/dL   BUN 8 6 - 20 mg/dL   Creatinine, Ser 8.34 0.44 - 1.00 mg/dL   Calcium 9.2 8.9 - 19.6 mg/dL   Total Protein 7.3 6.5 - 8.1 g/dL   Albumin 4.2 3.5 - 5.0 g/dL   AST 34 15 - 41 U/L   ALT 17 0 - 44 U/L   Alkaline Phosphatase 66 38 - 126 U/L   Total Bilirubin 1.2 0.3 - 1.2 mg/dL   GFR calc non Af Amer >60 >60 mL/min   GFR calc Af Amer >60 >60 mL/min   Anion gap 11 5 - 15    Comment: Performed at [facility], [address] [place], [place] [address]  Lipase, blood     Status: None   Collection Time: 09/27/18  8:22 AM  Result Value Ref Range   Lipase 28 11 - 51 U/L    Comment: Performed at [facility], [address], [place], [place] [address]  I-Stat Beta hCG blood, ED ([facility], [facility], [facility] only)     Status: None   Collection Time: 09/27/18 10:07 AM  Result Value Ref Range   I-stat hCG, quantitative <5.0 <5 mIU/mL   Comment 3            Comment:   GEST. AGE      CONC.  (mIU/mL)   <=1 WEEK        5 - 50     2 WEEKS  50 - 500     3 WEEKS       100 - 10,000     4 WEEKS     1,000 - 30,000        FEMALE AND NON-PREGNANT FEMALE:     LESS THAN 5 mIU/mL     Ct Abdomen Pelvis W Contrast  Result Date: 09/27/2018 CLINICAL DATA:  Abdominal pain. Abdominal pain started this morning at 3 a.m. EXAM: CT ABDOMEN AND PELVIS WITH CONTRAST TECHNIQUE: Multidetector CT imaging of the abdomen and pelvis was performed using the standard protocol following bolus administration of intravenous contrast. CONTRAST:  100mL ISOVUE-300 IOPAMIDOL (ISOVUE-300) INJECTION 61% COMPARISON:  10/08/2017 FINDINGS: Lower chest: No acute abnormality. Hepatobiliary: No focal liver abnormality is seen. Cholelithiasis. No pericholecystic fluid or gallbladder wall thickening. 2.5 cm gallstone in the gallbladder neck. No intrahepatic or extrahepatic biliary ductal dilatation. Pancreas: Unremarkable. No pancreatic ductal dilatation or surrounding inflammatory changes. Spleen: Normal in size without focal abnormality. Adrenals/Urinary Tract: Adrenal glands are unremarkable. Kidneys are normal, without renal calculi, focal lesion, or hydronephrosis. Bladder is unremarkable. Stomach/Bowel: Stomach is within normal limits. Appendix appears normal. No evidence of bowel wall thickening, distention, or inflammatory changes. Vascular/Lymphatic: No significant vascular findings are present. No enlarged abdominal or pelvic lymph nodes. Reproductive: Uterus and bilateral adnexa are unremarkable. Other: No abdominal wall hernia or abnormality. No abdominopelvic ascites. Musculoskeletal: No acute or significant osseous findings. 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 Bilateral foraminal stenosis at L5-S1. IMPRESSION: 1. No bowel obstruction. 2. Cholelithiasis with a 2.5 cm gallstone in the gallbladder neck. Electronically Signed   By: Elige KoHetal  Patel   On: 09/27/2018 11:06    Review of Systems  Constitutional: Negative.    HENT: Negative.   Eyes: Negative.   Respiratory: Negative.   Cardiovascular: Negative.   Gastrointestinal: Positive for abdominal pain, nausea and vomiting. Negative for blood in stool, constipation, diarrhea, heartburn and melena.  Genitourinary: Negative.   Musculoskeletal: Negative.   Skin: Negative.   Neurological: Negative.   Endo/Heme/Allergies: Negative.   Psychiatric/Behavioral: Negative.    Blood pressure (!) 116/53, pulse 79, temperature 98.2 F (36.8 C), temperature source Oral, resp. rate (!) 22, SpO2 98 %. Physical Exam  Constitutional: She is oriented to person, place, and time. She appears well-developed and well-nourished. She appears distressed (She is having a good deal of pain and is very anxious.).  HENT:  Head: Normocephalic and atraumatic.  Mouth/Throat: Oropharynx is clear and moist. No oropharyngeal exudate.  Eyes: Right eye exhibits no discharge. Left eye exhibits no discharge. No scleral icterus.  Pupils are equal  Neck: Normal range of motion. Neck supple. No JVD present. No tracheal deviation present. No thyromegaly present.  Cardiovascular: Normal rate, regular rhythm, normal heart sounds and intact distal pulses.  No murmur heard. Respiratory: Effort normal and breath sounds normal. No respiratory distress. She has no wheezes. She has no rales. She exhibits no tenderness.  GI: Soft. Bowel sounds are normal. She exhibits no distension and no mass. There is abdominal tenderness (She is extremely tender in the right upper quadrant less so right lower quadrant.). There is no rebound and no guarding.  Musculoskeletal:        General: No tenderness or edema.  Lymphadenopathy:    She has no cervical adenopathy.  Neurological: She is alert and oriented to person, place, and time. No cranial nerve deficit.  Skin: Skin is warm and dry. No rash noted. She is not  diaphoretic. No erythema. No pallor.  Psychiatric: She has a normal mood and affect. Her behavior is  normal. Judgment and thought content normal.  She is very anxious.    Assessment/Plan: Symptomatic cholelithiasis History of ectopic pregnancy C section/salpingo-ectomy Hx postpartum depression Hx partial small bowel obstruction 2016 Hx sickle cell trait.  Plan: Patient is crying and extremely uncomfortable.  She says she has had these pains before but they usually go away.  CT shows a 2-1/2 cm stone in the neck of the gallbladder.  Recommended laparoscopic cholecystectomy with possible IOC today.  Risk and benefits were discussed with patient in detail.  Patient is agreeable to admission and surgery today.  Evola Hollis 09/27/2018, 11:48 AM

## 2018-09-27 NOTE — ED Triage Notes (Addendum)
Pt here c/o sudden onset R and L upper quadrant abdominal pain that woke her out of her sleep at 3am today. Pt vomited once shortly after pain onset. States she ate a hotdog last night for the first time since changing her diet to fruits and vegetables. LBM yesterday.

## 2018-09-27 NOTE — Op Note (Signed)
Operative Note  Patricia Holloway 38 y.o. female [id number]  09/27/2018  Surgeon: Berna Bue MD  Assistant: Carlena Bjornstad PA-C  Procedure performed: Laparoscopic Cholecystectomy  Preop diagnosis: acute cholecystitis Post-op diagnosis/intraop findings: acute on chronic cholecystitis  Specimens: gallbladder  EBL: minimal  Complications: none  Description of procedure: After obtaining informed consent the patient was brought to the operating room. Prophylactic antibiotics were administered. SCD's were applied. General endotracheal anesthesia was initiated and a formal time-out was performed. The abdomen was prepped and draped in the usual sterile fashion and the abdomen was entered using visiport technique in the left upper quadrant after instilling the site with local. Insufflation to was obtained and gross inspection revealed no evidence of injury from our entry or other intraabdominal abnormalities. Two 80mm trocars were introduced in the right midclavicular and right anterior axillary lines under direct visualization and following infiltration with local. Entry port was upsized to an 11 mm trocar. There are omental adhesions to the abdominal wall at the level of the umbilicus which had to be taken down in order to place our camera trocar, this was done sharply. The gallbladder was quite distended and had to be decompressed with the Nezhat aspirator in order to be grasped. It was then retracted cephalad and the infundibulum was retracted laterally. Omental adhesions to the gallbladder body and infundibulum were taken down with cautery. A combination of hook electrocautery and blunt dissection was utilized to clear the peritoneum from the neck and cystic duct, circumferentially isolating the cystic artery and cystic duct and lifting the gallbladder from the cystic plate. The artery appeared to wrap around the anterior surface of the duct and somewhat distorted the anatomy. Careful  dissection was employed with the cautery and blunt dissection until all structures were completely identified. She does have a large right hepatic artery which was visible just below the field of dissection and then the gallbladder infundibulum was carefully dissected away from this taking standby strand of filmy tissue to ensure no injury to local structures. It also appeared as though the common duct was tethered medially by the short cystic duct. This was carefully protected. The gallbladder was further mobilized from the liver plate and once we were able to completely identify each structure and the critical view of safety was achieved with the cystic artery, cystic duct, and liver bed visualized between them with no other structures, the artery was clipped with a single clip proximally and distally and divided as was the cystic duct with three clips on the proximal end, taking care not to impinge on the nearby common duct. The gallbladder was dissected from the liver plate using electrocautery. Once freed the gallbladder was placed in an endocatch bag and removed intact through the epigastric trocar site, it should have to be extended to accommodate the very large stone in the gallbladder. A small amount of bleeding on the liver bed was controlled with cautery. Tthe right upper quadrant was irrigated and aspirated; the effluent was clear. Hemostasis was once again confirmed, and reinspection of the abdomen revealed no injuries. The clips were well opposed without any bile leak from the duct or the liver bed. The omentum which had been taken down from the abdominal wall was inspected and confirmed to be hemostatic. A small clot was aspirated from the pelvis. The 29mm trocar site in the epigastrium was closed with 2 0 vicryls in the fascia under direct visualization using a PMI device. The abdomen was desufflated and all trocars  removed. The skin incisions were closed with running subcuticular monocryl and  Dermabond. The patient was awakened, extubated and transported to the recovery room in stable condition.   All counts were correct at the completion of the case.

## 2018-09-27 NOTE — ED Provider Notes (Signed)
[facility] Provider Note   CSN: [id number] Arrival date & time: 09/27/18  0807     History   Chief Complaint Chief Complaint  Patient presents with  . Abdominal Pain    HPI Patricia Holloway is a 38 y.o. female presenting for evaluation of abdominal pain, nausea, vomiting.  Patient states she was awoken from sleep this morning at 3:00 in the morning with severe upper abdominal pain.  Pain is constant.  She states it feels like she needs to burp, but is unable to do so.  She has vomited once, reports continued nausea.  Patient states this feels similar to when she had a bowel obstruction.  She states she has not been able to have a bowel movement today, which is abnormal.  She denies urinary symptoms.  She denies fevers, chills, chest pain, shortness of breath.  She has a history of C-section and salpingectomy, no other abdominal surgeries.  She has not taken anything for pain.  Patient states she is recently been on a fruit and vegetable diet, had a hot dog last night before going to bed.  She does have a history of reflux, but does not take anything for this.  HPI  Past Medical History:  Diagnosis Date  . Ectopic pregnancy   . Partial small bowel obstruction (HCC) 09/2014  . PONV (postoperative nausea and vomiting)   . Postpartum depression   . Sickle cell trait (HCC)   . Spinal headache     Patient Active Problem List   Diagnosis Date Noted  . MDD (major depressive disorder), recurrent episode, moderate (HCC) 02/03/2017  . GAD (generalized anxiety disorder) 02/03/2017  . Partial small bowel obstruction (HCC) 10/08/2014  . Abdominal pain   . Cesarean delivery delivered 04/06/2014    Past Surgical History:  Procedure Laterality Date  . CESAREAN SECTION N/A 04/06/2014   Procedure: REPEAT CESAREAN SECTION;  Surgeon: Essie HartWalda Pinn, MD;  Location: [facility];  Service: Obstetrics;  Laterality: N/A;  . ECTOPIC PREGNANCY SURGERY  2003  .  UNILATERAL SALPINGECTOMY Right 2004     OB History    Gravida  8   Para  5   Term  3   Preterm      AB  3   Living  5     SAB      TAB  1   Ectopic  2   Multiple      Live Births  5            Home Medications    Prior to Admission medications   Medication Sig Start Date End Date Taking? Authorizing Provider  cetirizine (ZYRTEC) 10 MG tablet Take 10 mg by mouth as needed for allergies.   Yes [provider]  amoxicillin (AMOXIL) 500 MG capsule Take 1 capsule (500 mg total) by mouth 3 (three) times daily. Patient not taking: Reported on 09/27/2018 05/28/15   Arthor CaptainHarris, Abigail, PA-C  cyclobenzaprine (FLEXERIL) 5 MG tablet Take 1 tablet (5 mg total) by mouth at bedtime as needed for muscle spasms. Patient not taking: Reported on 09/27/2018 01/05/18   Yuko Coventry, PA-C  HYDROcodone-acetaminophen (NORCO) 5-325 MG per tablet Take 1-2 tablets by mouth every 6 (six) hours as needed for moderate pain. Patient not taking: Reported on 09/27/2018 05/28/15   Arthor CaptainHarris, Abigail, PA-C  hydrocortisone valerate cream (WESTCORT) 0.2 % Apply 1 application topically 2 (two) times daily. Patient not taking: Reported on 09/27/2018 03/25/15   Doreene ElandEniola, Kehinde T,  MD  naproxen (NAPROSYN) 500 MG tablet Take 1 tablet (500 mg total) by mouth 2 (two) times daily with a meal. Patient not taking: Reported on 09/27/2018 01/05/18   Jazlynne Milliner, PA-C    Family History Family History  Problem Relation Age of Onset  . Heart disease Mother   . Sickle cell trait Father     Social History Social History   Tobacco Use  . Smoking status: Current Some Day Smoker    Packs/day: 0.50    Years: 5.00    Pack years: 2.50  . Smokeless tobacco: Never Used  Substance Use Topics  . Alcohol use: Yes    Alcohol/week: 1.0 standard drinks    Types: 1 Cans of beer per week  . Drug use: No     Allergies   Pork-derived products   Review of Systems Review of Systems  Gastrointestinal: Positive for  abdominal pain, constipation, nausea and vomiting.  All other systems reviewed and are negative.    Physical Exam Updated Vital Signs BP (!) 116/53   Pulse (!) 51   Temp 98.2 F (36.8 C) (Oral)   Resp 20   SpO2 100%   Physical Exam Vitals signs and nursing note reviewed.  Constitutional:      General: She is not in acute distress.    Appearance: She is well-developed.     Comments: Appear uncomfortable, but in NAD  HENT:     Head: Normocephalic and atraumatic.  Eyes:     Conjunctiva/sclera: Conjunctivae normal.     Pupils: Pupils are equal, round, and reactive to light.  Neck:     Musculoskeletal: Normal range of motion and neck supple.  Cardiovascular:     Rate and Rhythm: Normal rate and regular rhythm.  Pulmonary:     Effort: Pulmonary effort is normal. No respiratory distress.     Breath sounds: Normal breath sounds. No wheezing.  Abdominal:     General: Bowel sounds are normal. There is no distension.     Palpations: Abdomen is soft.     Tenderness: There is abdominal tenderness in the right upper quadrant, epigastric area and left upper quadrant.     Comments: TTP of upper abd. No TTP elsewhere. No rigidity or distention. Negative rebound.   Musculoskeletal: Normal range of motion.  Skin:    General: Skin is warm and dry.     Capillary Refill: Capillary refill takes less than 2 seconds.  Neurological:     Mental Status: She is alert and oriented to person, place, and time.      ED Treatments / Results  Labs (all labs ordered are listed, but only abnormal results are displayed) Labs Reviewed  COMPREHENSIVE METABOLIC PANEL - Abnormal; Notable for the following components:      Result Value   CO2 21 (*)    Glucose, Bld 114 (*)    All other components within normal limits  CBC WITH DIFFERENTIAL/PLATELET  LIPASE, BLOOD  I-STAT BETA HCG BLOOD, ED ([facility], [facility], [facility] ONLY)    EKG None  Radiology Ct Abdomen Pelvis W Contrast  Result Date: 09/27/2018 CLINICAL  DATA:  Abdominal pain. Abdominal pain started this morning at 3 a.m. EXAM: CT ABDOMEN AND PELVIS WITH CONTRAST TECHNIQUE: Multidetector CT imaging of the abdomen and pelvis was performed using the standard protocol following bolus administration of intravenous contrast. CONTRAST:  ISOVUE-300 IOPAMIDOL (ISOVUE-300) INJECTION 61% COMPARISON:  10/08/2017 FINDINGS: Lower chest: No acute abnormality. Hepatobiliary: No focal liver abnormality is seen. Cholelithiasis. No  pericholecystic fluid or gallbladder wall thickening. 2.5 cm gallstone in the gallbladder neck. No intrahepatic or extrahepatic biliary ductal dilatation. Pancreas: Unremarkable. No pancreatic ductal dilatation or surrounding inflammatory changes. Spleen: Normal in size without focal abnormality. Adrenals/Urinary Tract: Adrenal glands are unremarkable. Kidneys are normal, without renal calculi, focal lesion, or hydronephrosis. Bladder is unremarkable. Stomach/Bowel: Stomach is within normal limits. Appendix appears normal. No evidence of bowel wall thickening, distention, or inflammatory changes. Vascular/Lymphatic: No significant vascular findings are present. No enlarged abdominal or pelvic lymph nodes. Reproductive: Uterus and bilateral adnexa are unremarkable. Other: No abdominal wall hernia or abnormality. No abdominopelvic ascites. Musculoskeletal: No acute or significant osseous findings. 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 Bilateral foraminal stenosis at L5-S1. IMPRESSION: 1. No bowel obstruction. 2. Cholelithiasis with a 2.5 cm gallstone in the gallbladder neck. Electronically Signed   By: Elige Ko   On: 09/27/2018 11:06    Procedures Procedures (including critical care time)  Medications Ordered in ED Medications  iohexol (OMNIPAQUE) 300 MG/ML solution 100 mL (has no administration in time range)  ondansetron (ZOFRAN) injection 4 mg (4 mg Intravenous Given 09/27/18 0839)    morphine 4 MG/ML injection 4 mg (4 mg Intravenous Given 09/27/18 0839)  HYDROmorphone (DILAUDID) injection 0.5 mg (0.5 mg Intravenous Given 09/27/18 0943)  iopamidol (ISOVUE-300) 61 % injection 100 mL (100 mLs Intravenous Contrast Given 09/27/18 1056)     Initial Impression / Assessment and Plan / ED Course  I have reviewed the triage vital signs and the nursing notes.  Pertinent labs & imaging results that were available during my care of the patient were reviewed by me and considered in my medical decision making (see chart for details).     Patient presenting for evaluation of nausea, vomiting, abdominal pain.  Physical exam shows patient appears uncomfortable, but in no acute distress.  She is afebrile not tachycardic.  Tenderness palpation of upper abdomen.  Patient with recent change in her diet, including a hot dog last night.  Consider possible GERD, reflux, PUD, cholecystitis.  Additionally, having difficulty having a bowel movement, and history of bowel obstruction, consider repeat SBO.  Will obtain labs, give morphine and Zofran for symptom control, and order CT abdomen pelvis for further evaluation.  On reassessment, pain is uncontrolled with morphine.  Will give half milligram Dilaudid.  Labs reassuring, no leukocytosis.  Pregnancy negative.  Electrolytes stable.  CT pending.  On reassessment, pain controlled with Dilaudid.  CT shows 2.5 cm gallstone in the gallbladder duct.  No signs of cholecystitis.  However, considering size and location of stone, will consult with general surgery.  Discussed with general surgery, will evaluate the patient.  Surgery to admit.  Final Clinical Impressions(s) / ED Diagnoses   Final diagnoses:  Biliary calculus of other site without obstruction    ED Discharge Orders    None       Alveria Apley, PA-C 09/27/18 1240    Mesner, Barbara Cower, MD 09/29/18 [phone]

## 2018-09-28 ENCOUNTER — Encounter (HOSPITAL_COMMUNITY): Payer: Self-pay | Admitting: Surgery

## 2018-09-28 LAB — HIV ANTIBODY (ROUTINE TESTING W REFLEX): HIV Screen 4th Generation wRfx: NONREACTIVE

## 2018-09-28 MED ORDER — FAMOTIDINE IN NACL 20-0.9 MG/50ML-% IV SOLN
20.0000 mg | Freq: Two times a day (BID) | INTRAVENOUS | Status: DC
  Administered 2018-09-28 – 2018-09-29 (×3): 20 mg via INTRAVENOUS
  Filled 2018-09-28 (×4): qty 50

## 2018-09-28 MED ORDER — ALUM & MAG HYDROXIDE-SIMETH 200-200-20 MG/5ML PO SUSP
30.0000 mL | Freq: Four times a day (QID) | ORAL | Status: DC | PRN
  Administered 2018-09-28: 30 mL via ORAL
  Filled 2018-09-28: qty 30

## 2018-09-28 MED ORDER — OXYCODONE HCL 5 MG PO TABS: 5.0000 mg | ORAL_TABLET | ORAL | 0 refills | Status: DC | PRN

## 2018-09-28 MED ORDER — IBUPROFEN 200 MG PO TABS: ORAL_TABLET | ORAL | Status: DC

## 2018-09-28 MED ORDER — SUGAMMADEX SODIUM 200 MG/2ML IV SOLN
INTRAVENOUS | Status: DC | PRN
  Administered 2018-09-27: 200 mg via INTRAVENOUS

## 2018-09-28 MED ORDER — SODIUM CHLORIDE 0.9% FLUSH
10.0000 mL | INTRAVENOUS | Status: DC | PRN
  Administered 2018-09-28: 10 mL
  Filled 2018-09-28: qty 40

## 2018-09-28 MED ORDER — ACETAMINOPHEN 500 MG PO TABS: ORAL_TABLET | ORAL | Status: DC

## 2018-09-28 MED ORDER — IBUPROFEN 600 MG PO TABS
600.0000 mg | ORAL_TABLET | Freq: Four times a day (QID) | ORAL | Status: DC | PRN
  Administered 2018-09-28 – 2018-09-29 (×2): 600 mg via ORAL
  Filled 2018-09-28 (×2): qty 1

## 2018-09-28 NOTE — Progress Notes (Deleted)
Pt ready for DC to home.  Dietician talking to pt now.  [facility] set up for pt for follow up after DC at home.  Pt has O2 at home for home use.  Contacted Diabetes Coordinator for any recommendations per pt request for sliding scale at home.  Pt is being discharged on 20 of Prednisone.  Rx given and explained.  All follow up appointments given and reviewed.

## 2018-09-28 NOTE — Discharge Instructions (Signed)
[facility] ______[facility] °LAPAROSCOPIC SURGERY: POST OP INSTRUCTIONS °Always review your discharge instruction sheet given to you by the facility where your surgery was performed. °IF YOU HAVE DISABILITY OR FAMILY LEAVE FORMS, YOU MUST BRING THEM TO THE OFFICE FOR PROCESSING.   °DO NOT GIVE THEM TO YOUR DOCTOR. ° °1. A prescription for pain medication may be given to you upon discharge.  Take your pain medication as prescribed, if needed.  If narcotic pain medicine is not needed, then you may take acetaminophen (Tylenol) or ibuprofen (Advil) as needed. °2. Take your usually prescribed medications unless otherwise directed. °3. If you need a refill on your pain medication, please contact your pharmacy.  They will contact our office to request authorization. Prescriptions will not be filled after 5pm or on week-ends. °4. You should follow a light diet the first few days after arrival home, such as soup and crackers, etc.  Be sure to include lots of fluids daily. °5. Most patients will experience some swelling and bruising in the area of the incisions.  Ice packs will help.  Swelling and bruising can take several days to resolve.  °6. It is common to experience some constipation if taking pain medication after surgery.  Increasing fluid intake and taking a stool softener (such as Colace) will usually help or prevent this problem from occurring.  A mild laxative (Milk of Magnesia or Miralax) should be taken according to package instructions if there are no bowel movements after 48 hours. °7. Unless discharge instructions indicate otherwise, you may remove your bandages 24-48 hours after surgery, and you may shower at that time.  You may have steri-strips (small skin tapes) in place directly over the incision.  These strips should be left on the skin for 7-10 days.  If your surgeon used skin glue on the incision, you may shower in 24 hours.  The glue will flake off over the next 2-3 weeks.  Any sutures or  staples will be removed at the office during your follow-up visit. °8. ACTIVITIES:  You may resume regular (light) daily activities beginning the next day--such as daily self-care, walking, climbing stairs--gradually increasing activities as tolerated.  You may have sexual intercourse when it is comfortable.  Refrain from any heavy lifting or straining until approved by your doctor. °a. You may drive when you are no longer taking prescription pain medication, you can comfortably wear a seatbelt, and you can safely maneuver your car and apply brakes. °b. RETURN TO WORK:  __________________________________________________________ °9. You should see your doctor in the office for a follow-up appointment approximately 2-3 weeks after your surgery.  Make sure that you call for this appointment within a day or two after you arrive home to insure a convenient appointment time. °10. OTHER INSTRUCTIONS: __________________________________________________________________________________________________________________________ __________________________________________________________________________________________________________________________ °WHEN TO CALL YOUR DOCTOR: °1. Fever over 101.0 °2. Inability to urinate °3. Continued bleeding from incision. °4. Increased pain, redness, or drainage from the incision. °5. Increasing abdominal pain ° °The clinic staff is available to answer your questions during regular business hours.  Please don’t hesitate to call and ask to speak to one of the nurses for clinical concerns.  If you have a medical emergency, go to the nearest emergency room or call 911.  A surgeon from [facility] is always on call at the hospital. °[address], [address], [place], [place]  [address] ? [address] [address], [place], [place]   [address] °[phone] ? [phone] ? FAX [phone] °Web site:   [email or web address] °

## 2018-09-28 NOTE — Discharge Summary (Addendum)
Physician Discharge Summary  Patricia Holloway:[id number] DOB: 08/22/1981 DOA: 09/27/2018  PCP: Patient, No Pcp Per  Admit date: 09/27/2018 Discharge date: 09/29/18  Recommendations for Outpatient Follow-up:    Follow-up Information    [facility] Follow up on 10/11/2018.   Specialty:  General Surgery Why:  Your appointment is at 9:15 AM.  Be at the office 30 minutes early for check-in.  Bring photo ID and insurance information. Contact information: [address] [place] [address] [phone]          Discharge Diagnoses:  1. Acute cholecystitis/cholelithiasis 2. Hx ectopic pregnancy/C-section/salpingo-ectomy 3. Hx postpartum depression 4. Hx of partial small bowel obstruction 2016 5. Hx sickle cell trait  Surgical Procedure: Laparoscopic cholecystectomy  Discharge Condition: Improved  disposition: Home  Diet recommendation: Soft diet for 1 week  There were no vitals filed for this visit.  History of present illness:   She is a 38 year old female who presented to the ED with the above-noted complaints.  She is treated with Zofran and seen by the emergency department staff.  She reports she has had these pains before but is never been this long and this bad.  It usually occurs after eating and she pretty much cut down to just vegetables and occasional chicken.  She had a hot dog yesterday and thinks this set off her spell.  Work-up shows she is afebrile vital signs were stable.  Labs shows a normal white count, hemoglobin 12.3, hematocrit 39.8, platelets 278,000.  LFTs are normal with a lipase of 28 AST 34 ALT 17 total bilirubin 1.2.  A CT of the abdomen was obtained which shows cholelithiasis and there is a 2.5 cm gallstone in the gallbladder neck.  There is no intrahepatic or extrahepatic biliary dilatation.  The else was remarkable on the CT sub-some disc disease L5-S1 area.  We are asked to see.  She is in the ED she sitting up crying with  ongoing pain in her right upper quadrant.  Hospital Course:  She was seen in the emergency department, and taken directly to the operating room for surgery.  Underwent laparoscopic cholecystectomy without issue.  Postop she is doing well.  She is up walking, tolerated diet.  Being converted over to oral pain medications and is ready for discharge later this a.m.  She complained of heart burn and pain.  Her discharge was held up for an additional 24 hours.  She is going home POD#2.   Discharge Instructions: Postop laparoscopic discharge instructions in the AVS  Allergies as of 09/28/2018      Reactions   Pork-derived Products Other (See Comments)   stomach upset when she eats pork      Medication List    STOP taking these medications   amoxicillin 500 MG capsule Commonly known as:  AMOXIL   cyclobenzaprine 5 MG tablet Commonly known as:  FLEXERIL   HYDROcodone-acetaminophen 5-325 MG tablet Commonly known as:  NORCO   hydrocortisone valerate cream 0.2 % Commonly known as:  WESTCORT   naproxen 500 MG tablet Commonly known as:  NAPROSYN     TAKE these medications   acetaminophen 500 MG tablet Commonly known as:  TYLENOL You can take 1000 mg every 8 hours.  This is your first line medication for pain.  You can buy this over-the-counter at any drugstore.  Do not exceed this limit it can harm your liver.   cetirizine 10 MG tablet Commonly known as:  ZYRTEC Take 10  mg by mouth as needed for allergies.   ibuprofen 200 MG tablet Commonly known as:  ADVIL,MOTRIN You can take 2 to 3 tablets every 6 hours as needed for pain.  You can start this 2 or 3 hours after the Tylenol(acetaminophen) for additional pain relief.  You can buy this over-the-counter at any drugstore without a prescription. If the combination of Tylenol and ibuprofen are not adequate you can then use the prescribed oxycodone.   oxyCODONE 5 MG immediate release tablet Commonly known as:  Oxy IR/ROXICODONE Take 1  tablet (5 mg total) by mouth every 4 (four) hours as needed for moderate pain or severe pain.      Follow-up Information    [facility] Follow up on 10/11/2018.   Specialty:  General Surgery Why:  Your appointment is at 9:15 AM.  Be at the office 30 minutes early for check-in.  Bring photo ID and insurance information. Contact information: [address] [place] [address] [phone]            The results of significant diagnostics from this hospitalization (including imaging, microbiology, ancillary and laboratory) are listed below for reference.    Significant Diagnostic Studies: Ct Abdomen Pelvis W Contrast  Result Date: 09/27/2018 CLINICAL DATA:  Abdominal pain. Abdominal pain started this morning at 3 a.m. EXAM: CT ABDOMEN AND PELVIS WITH CONTRAST TECHNIQUE: Multidetector CT imaging of the abdomen and pelvis was performed using the standard protocol following bolus administration of intravenous contrast. CONTRAST:  ISOVUE-300 IOPAMIDOL (ISOVUE-300) INJECTION 61% COMPARISON:  10/08/2017 FINDINGS: Lower chest: No acute abnormality. Hepatobiliary: No focal liver abnormality is seen. Cholelithiasis. No pericholecystic fluid or gallbladder wall thickening. 2.5 cm gallstone in the gallbladder neck. No intrahepatic or extrahepatic biliary ductal dilatation. Pancreas: Unremarkable. No pancreatic ductal dilatation or surrounding inflammatory changes. Spleen: Normal in size without focal abnormality. Adrenals/Urinary Tract: Adrenal glands are unremarkable. Kidneys are normal, without renal calculi, focal lesion, or hydronephrosis. Bladder is unremarkable. Stomach/Bowel: Stomach is within normal limits. Appendix appears normal. No evidence of bowel wall thickening, distention, or inflammatory changes. Vascular/Lymphatic: No significant vascular findings are present. No enlarged abdominal or pelvic lymph nodes. Reproductive: Uterus and bilateral adnexa are  unremarkable. Other: No abdominal wall hernia or abnormality. No abdominopelvic ascites. Musculoskeletal: No acute or significant osseous findings. 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 Bilateral foraminal stenosis at L5-S1. IMPRESSION: 1. No bowel obstruction. 2. Cholelithiasis with a 2.5 cm gallstone in the gallbladder neck. Electronically Signed   By: Elige Ko   On: 09/27/2018 11:06    Microbiology: No results found for this or any previous visit (from the past 240 hour(s)).   Labs: Basic Metabolic Panel: Recent Labs  Lab 09/27/18 0822  NA 138  K 4.3  CL 106  CO2 21*  GLUCOSE 114*  BUN 8  CREATININE 0.91  CALCIUM 9.2   Liver Function Tests: Recent Labs  Lab 09/27/18 0822  AST 34  ALT 17  ALKPHOS 66  BILITOT 1.2  PROT 7.3  ALBUMIN 4.2   Recent Labs  Lab 09/27/18 0822  LIPASE 28   No results for input(s): AMMONIA in the last 168 hours. CBC: Recent Labs  Lab 09/27/18 0822  WBC 5.5  NEUTROABS 3.7  HGB 12.3  HCT 39.8  MCV 95.4  PLT 278   Cardiac Enzymes: No results for input(s): CKTOTAL, CKMB, CKMBINDEX, TROPONINI in the last 168 hours. BNP: BNP (last 3 results) No results  for input(s): BNP in the last 8760 hours.  ProBNP (last 3 results) No results for input(s): PROBNP in the last 8760 hours.  CBG: No results for input(s): GLUCAP in the last 168 hours.  Active Problems:   Symptomatic cholelithiasis   Time coordinating discharge: 30 minutes  Signed:  Will Marlyne BeardsJennings, [facility] [place] [phone] 09/28/2018, 9:55 AM

## 2018-09-28 NOTE — Progress Notes (Signed)
1 Day Post-Op    CC: Abdominal pain  Subjective: Patient is feeling much better this a.m.  She has a normal postop soreness but overall is doing quite well.  Has been up walking since 3 AM.  Tolerated soft diet for breakfast.  Objective: Vital signs in last 24 hours: Temp:  [97.3 F (36.3 C)-98 F (36.7 C)] 97.8 F (36.6 C) (01/08 0607) Pulse Rate:  [43-79] 53 (01/08 0607) Resp:  [12-24] 19 (01/08 0607) BP: (101-142)/(53-86) 117/67 (01/08 0607) SpO2:  [97 %-100 %] 100 % (01/08 0607) Last BM Date: 09/26/18 420 p.o. 1100 IV Urine x3 Afebrile vital signs are stable No labs today  Intake/Output from previous day: 01/07 0701 - 01/08 0700 In: 1522.6 [P.O.:420; I.V.:1102.6] Out: 50 [Blood:50] Intake/Output this shift: No intake/output data recorded.  General appearance: alert, cooperative and no distress Resp: clear to auscultation bilaterally GI: Soft, sore, port sites all look good.  Lab Results:  Recent Labs    09/27/18 0822  WBC 5.5  HGB 12.3  HCT 39.8  PLT 278    BMET Recent Labs    09/27/18 0822  NA 138  K 4.3  CL 106  CO2 21*  GLUCOSE 114*  BUN 8  CREATININE 0.91  CALCIUM 9.2   PT/INR No results for input(s): LABPROT, INR in the last 72 hours.  Recent Labs  Lab 09/27/18 0822  AST 34  ALT 17  ALKPHOS 66  BILITOT 1.2  PROT 7.3  ALBUMIN 4.2     Lipase     Component Value Date/Time   LIPASE 28 09/27/2018 0822     Medications: . acetaminophen  650 mg Oral QID   . 0.9 % NaCl with KCl 20 mEq / L 50 mL/hr at 09/28/18 2353   Anti-infectives (From admission, onward)   Start     Dose/Rate Route Frequency Ordered Stop   09/27/18 1230  cefTRIAXone (ROCEPHIN) 2 g in sodium chloride 0.9 % 100 mL IVPB  Status:  Discontinued     2 g 200 mL/hr over 30 Minutes Intravenous Every 24 hours 09/27/18 1220 09/27/18 1847     Assessment/Plan Hx ectopic pregnancy C-section/salpingo-ectomy Hx postpartum depression Hx partial small bowel obstruction  2016 Check sickle cell trait  Acute cholecystitis/cholelithiasis Laparoscopic cholecystectomy 09/27/2018, Dr. Twana First  FEN: IV fluids/soft diet ID: Rocephin preop DVT: SCDs Follow-up: [facility]  Plan: Home today, Tylenol, ibuprofen, and oxycodone for pain.     LOS: 0 days    Patricia Holloway 09/28/2018 [phone]

## 2018-09-28 NOTE — Progress Notes (Signed)
Pt requested Bible to read.  Chaplain called and notified and she will bring one up.

## 2018-09-28 NOTE — Progress Notes (Signed)
DC cancelled for tonight per Dr. Fredricka Bonine.  Will continue to monitor pt.  Encouraged IS use and ambulation, gas chair.

## 2018-09-28 NOTE — Anesthesia Postprocedure Evaluation (Signed)
Anesthesia Post Note  Patient: Patricia Holloway  Procedure(s) Performed: LAPAROSCOPIC CHOLECYSTECTOMY (N/A Abdomen)     Patient location during evaluation: PACU Anesthesia Type: General Level of consciousness: awake and alert Pain management: pain level controlled Vital Signs Assessment: post-procedure vital signs reviewed and stable Respiratory status: spontaneous breathing, nonlabored ventilation, respiratory function stable and patient connected to nasal cannula oxygen Cardiovascular status: blood pressure returned to baseline and stable Postop Assessment: no apparent nausea or vomiting Anesthetic complications: no    Last Vitals:  Vitals:   09/28/18 0245 09/28/18 0607  BP: (!) 101/54 117/67  Pulse: (!) 53 (!) 53  Resp: 18 19  Temp: 36.7 C 36.6 C  SpO2: 100% 100%               Shelton Silvas

## 2018-09-28 NOTE — Progress Notes (Signed)
Pt having some burning sensation and notified Dr. Fredricka Bonine, ordered Pepcid IV and will continue to monitor.

## 2018-09-29 MED ORDER — ALUM & MAG HYDROXIDE-SIMETH 200-200-20 MG/5ML PO SUSP: 30.0000 mL | Freq: Four times a day (QID) | ORAL | 0 refills | Status: DC | PRN

## 2018-09-29 NOTE — Progress Notes (Signed)
Pt ready for DC to home accompanied by friend.  DC instructions reviewed and copy given.  Rx of oxycodone e sent to her pharmacy.  All wound care and expectations reviewed.

## 2018-09-29 NOTE — Progress Notes (Signed)
2 Days Post-Op    CC: abdominal pain   Subjective: She is doing well this a.m. still complaining of some pain and has an ice pack over her main port site.  Sites all look fine she is tolerating diet well.  Objective: Vital signs in last 24 hours: Temp:  [98.2 F (36.8 C)-98.5 F (36.9 C)] 98.5 F (36.9 C) (01/09 0627) Pulse Rate:  [47-55] 51 (01/09 0627) Resp:  [18-19] 19 (01/09 0627) BP: (98-114)/(54-62) 98/54 (01/09 0627) SpO2:  [100 %] 100 % (01/09 0627) Last BM Date: 09/26/18 1310PO 1241 IV Urine x 7 Afebrile, VSS No labs Intake/Output from previous day: 01/08 0701 - 01/09 0700 In: 2626.1 [P.O.:1310; I.V.:1241.7; IV Piggyback:74.5] Out: -  Intake/Output this shift: No intake/output data recorded.  General appearance: alert, cooperative and no distress Resp: clear to auscultation bilaterally GI: Soft, sore, port sites all look fine.  Lab Results:  Recent Labs    09/27/18 0822  WBC 5.5  HGB 12.3  HCT 39.8  PLT 278    BMET Recent Labs    09/27/18 0822  NA 138  K 4.3  CL 106  CO2 21*  GLUCOSE 114*  BUN 8  CREATININE 0.91  CALCIUM 9.2   PT/INR No results for input(s): LABPROT, INR in the last 72 hours.  Recent Labs  Lab 09/27/18 0822  AST 34  ALT 17  ALKPHOS 66  BILITOT 1.2  PROT 7.3  ALBUMIN 4.2     Lipase     Component Value Date/Time   LIPASE 28 09/27/2018 0822     Medications: . acetaminophen  650 mg Oral QID    Assessment/Plan Hx ectopic pregnancy C-section/salpingo-ectomy Hx postpartum depression Hx partial small bowel obstruction 2016 Check sickle cell trait  Acute cholecystitis/cholelithiasis Laparoscopic cholecystectomy 09/27/2018, Dr. Twana First  POD2  FEN: IV fluids/soft diet ID: Rocephin preop DVT: SCDs [facility]-up: [facility]  Plan: Home today;  she is requesting a note for her lawyer and her court date scheduled today.      LOS: 0 days    Sonnia Strong 09/29/2018 [phone]

## 2019-08-21 IMAGING — CT CT ABD-PELV W/ CM
2 of 4 series · 17 of 46 positions shown, 19 images · IV contrast (Omni 300)
Comparison: 10/08/2017

CLINICAL DATA: Abdominal pain. Abdominal pain started this morning
at 3 a.m..

EXAM:
CT ABDOMEN AND PELVIS WITH CONTRAST
TECHNIQUE: Multidetector CT imaging of the abdomen and pelvis was performed
using the standard protocol following bolus administration of
intravenous contrast.
CONTRAST:  100mL D21P3K-HPP IOPAMIDOL (D21P3K-HPP) INJECTION 61%

[Series 3: a/p w/ 5mm · axial · 0.96mm/px · z∈[-678,-208]mm · 14 of 104 slices shown, 16 images]
[im 5/104  soft-tissue]
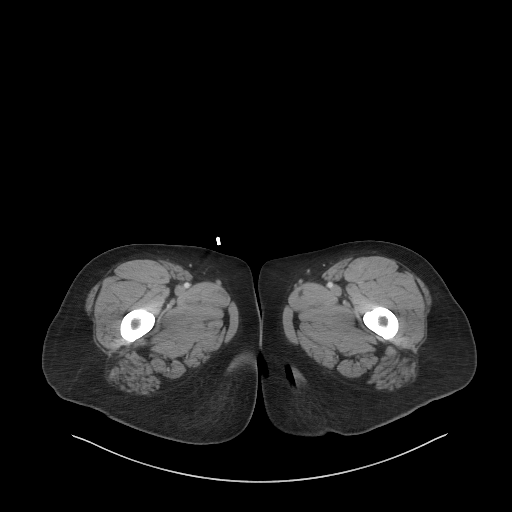
[im 5/104  bone]
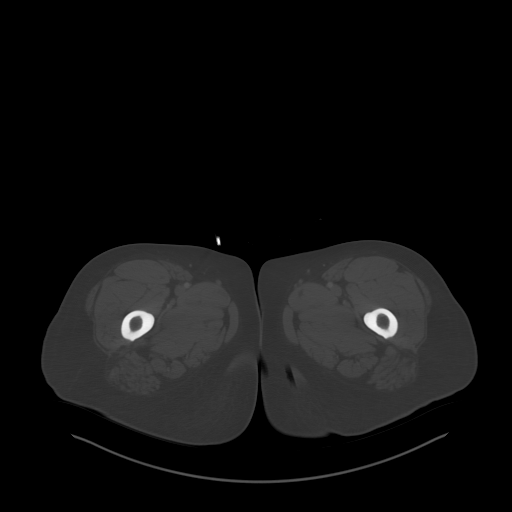
[im 13/104  soft-tissue]
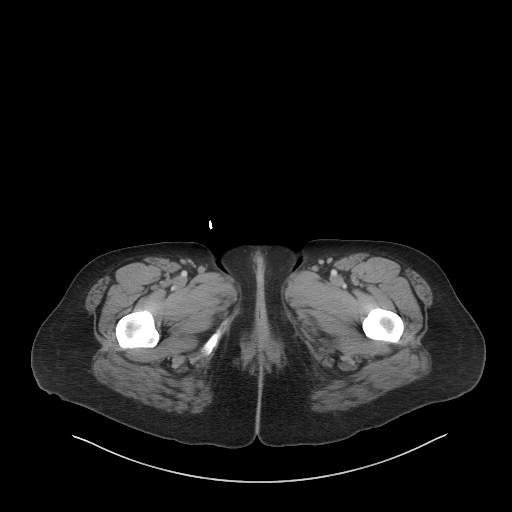
[im 21/104  soft-tissue]
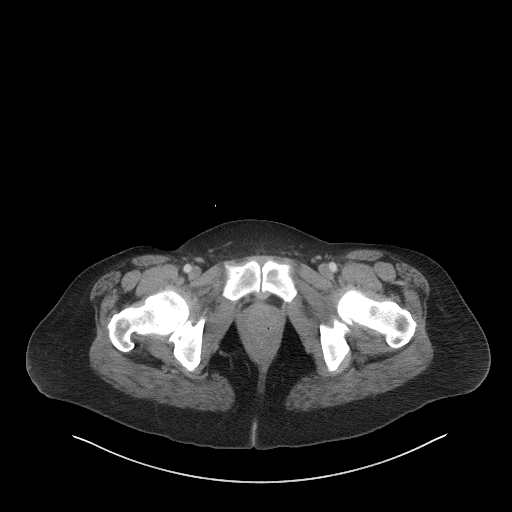
[im 29/104  soft-tissue]
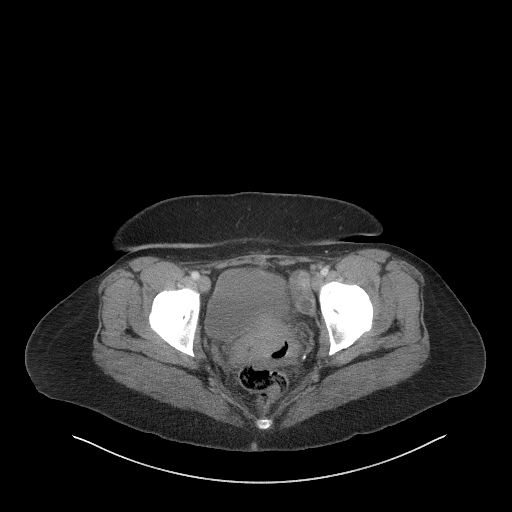
[im 33/104  soft-tissue]
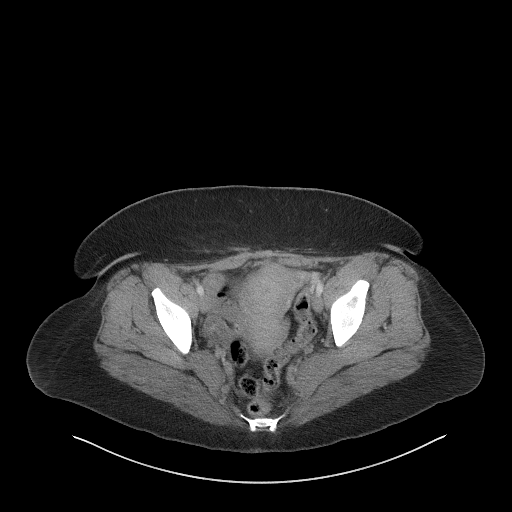
[im 42/104  soft-tissue]
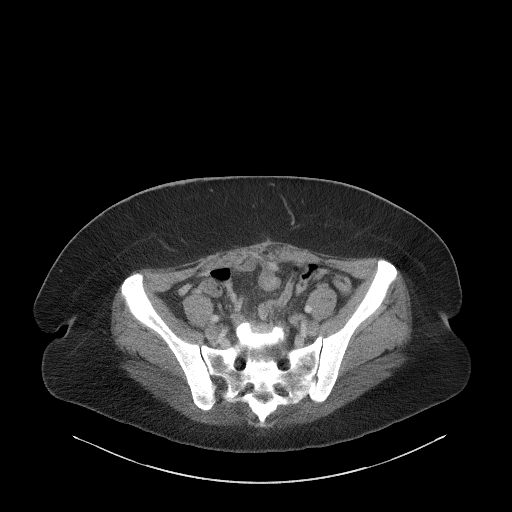
[im 50/104  soft-tissue]
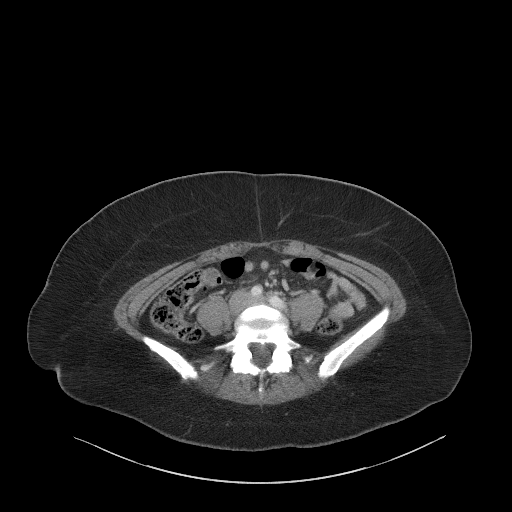
[im 54/104  soft-tissue]
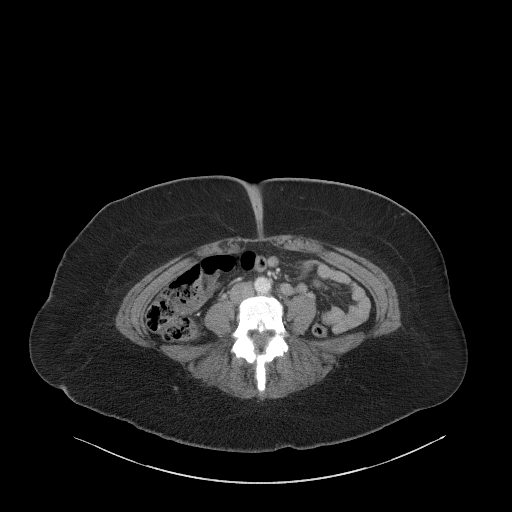
[im 62/104  soft-tissue]
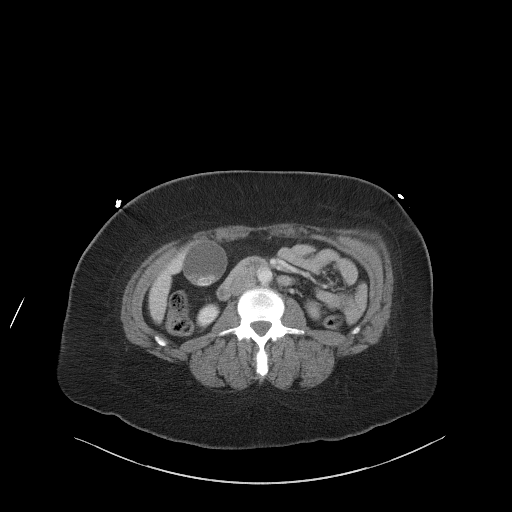
[im 62/104  bone]
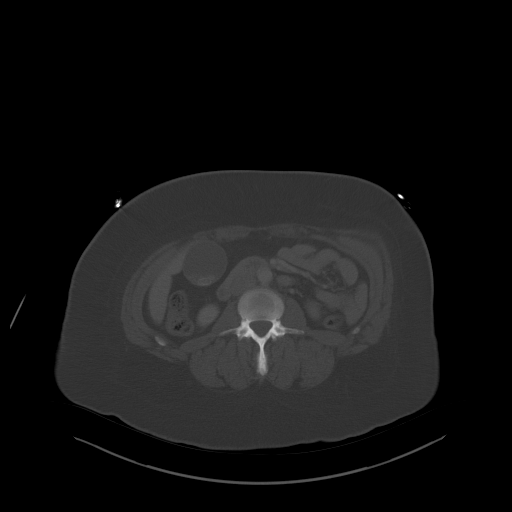
[im 71/104  soft-tissue]
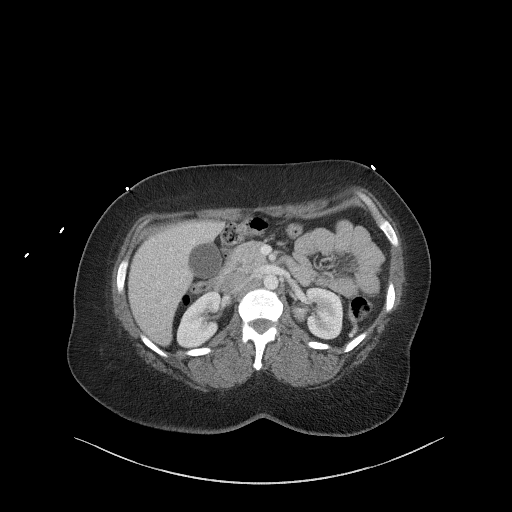
[im 79/104  soft-tissue]
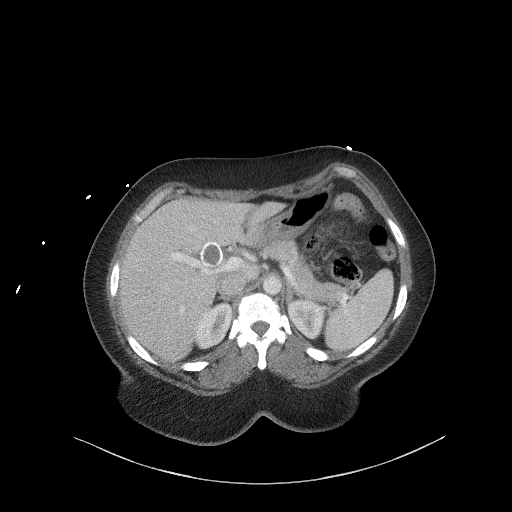
[im 83/104  soft-tissue]
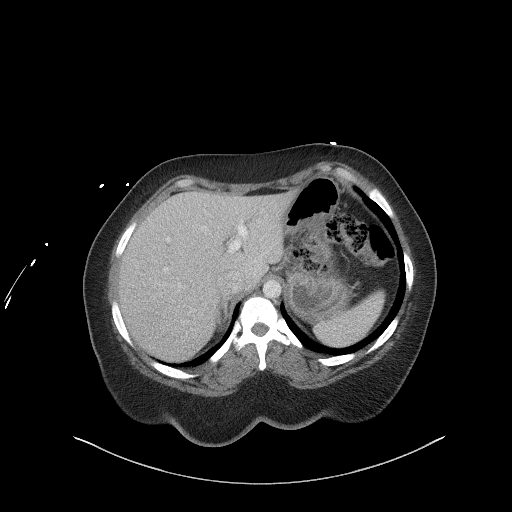
[im 91/104  soft-tissue]
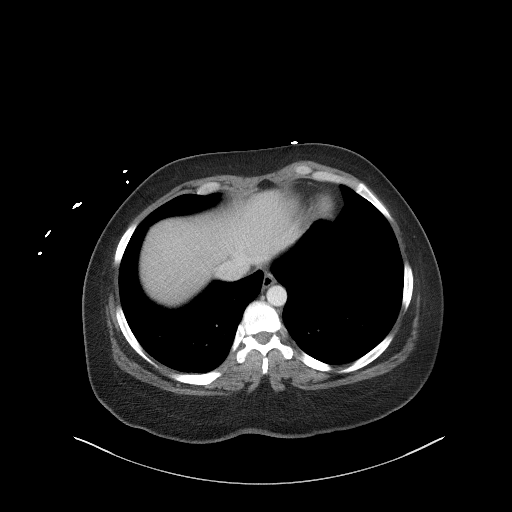
[im 99/104  soft-tissue]
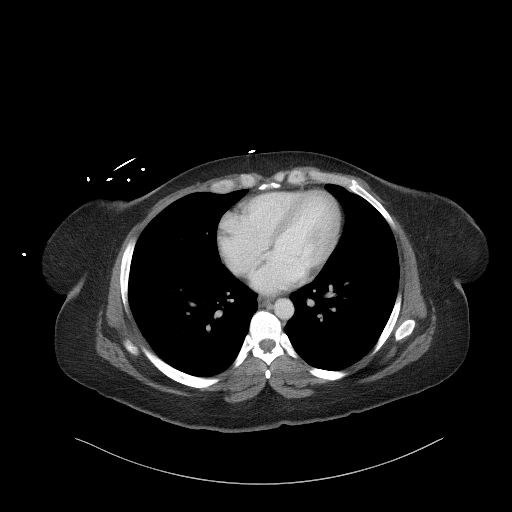

[Series 6: a/p w/ cor · coronal · 1.07mm/px · 3 of 201 slices shown]
[im 67/201  soft-tissue]
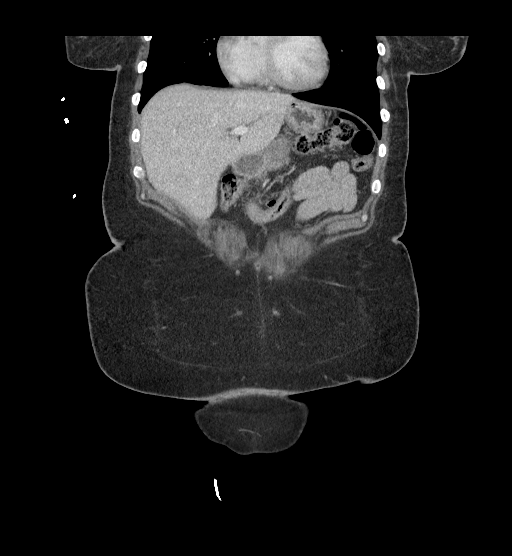
[im 89/201  soft-tissue]
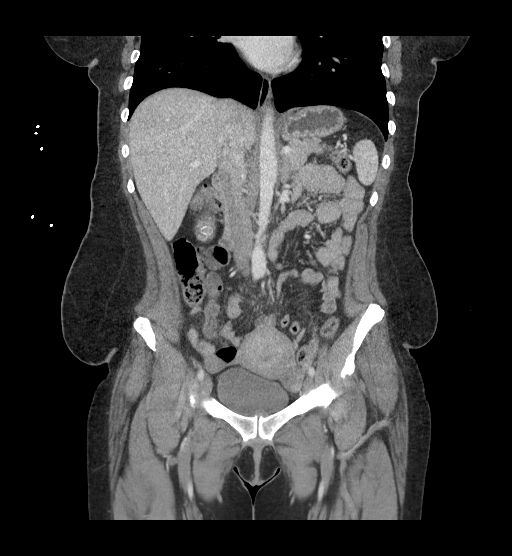
[im 112/201  soft-tissue]
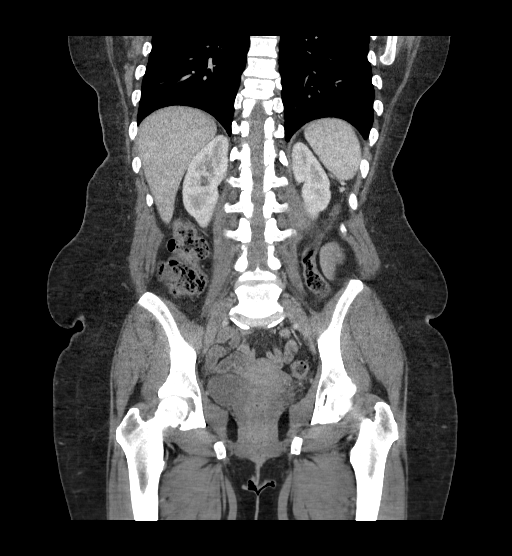

[17 of 46 positions shown; findings below may reference images not displayed]

FINDINGS: Lower chest: No acute abnormality.

Hepatobiliary: No focal liver abnormality is seen. Cholelithiasis.
No pericholecystic fluid or gallbladder wall thickening. 2.5 cm
gallstone in the gallbladder neck. No intrahepatic or extrahepatic
biliary ductal dilatation.

Pancreas: Unremarkable. No pancreatic ductal dilatation or
surrounding inflammatory changes.

Spleen: Normal in size without focal abnormality.

Adrenals/Urinary Tract: Adrenal glands are unremarkable. Kidneys are
normal, without renal calculi, focal lesion, or hydronephrosis.
Bladder is unremarkable.

Stomach/Bowel: Stomach is within normal limits. Appendix appears
normal. No evidence of bowel wall thickening, distention, or
inflammatory changes.

Vascular/Lymphatic: No significant vascular findings are present. No
enlarged abdominal or pelvic lymph nodes.

Reproductive: Uterus and bilateral adnexa are unremarkable.

Other: No abdominal wall hernia or abnormality. No abdominopelvic
ascites.

Musculoskeletal: No acute or significant osseous findings.
Degenerative disc disease with disc height loss at L5-S1 with
bilateral facet arthropathy. Bilateral foraminal stenosis at L5-S1.
IMPRESSION: 1. No bowel obstruction.
2. Cholelithiasis with a 2.5 cm gallstone in the gallbladder neck.

## 2023-08-12 ENCOUNTER — Ambulatory Visit (HOSPITAL_COMMUNITY)
Admission: EM | Admit: 2023-08-12 | Discharge: 2023-08-12 | Disposition: A | Discharge status: Home / Self Care | Payer: BC Managed Care – PPO | Attending: Nurse Practitioner | Admitting: Nurse Practitioner

## 2023-08-12 ENCOUNTER — Encounter (HOSPITAL_COMMUNITY): Payer: Self-pay | Admitting: Emergency Medicine

## 2023-08-12 DIAGNOSIS — B9789 Other viral agents as the cause of diseases classified elsewhere: Secondary | ICD-10-CM | POA: Insufficient documentation

## 2023-08-12 DIAGNOSIS — F17210 Nicotine dependence, cigarettes, uncomplicated: Secondary | ICD-10-CM | POA: Insufficient documentation

## 2023-08-12 DIAGNOSIS — J069 Acute upper respiratory infection, unspecified: Secondary | ICD-10-CM | POA: Diagnosis not present

## 2023-08-12 DIAGNOSIS — J029 Acute pharyngitis, unspecified: Secondary | ICD-10-CM | POA: Diagnosis present

## 2023-08-12 DIAGNOSIS — R509 Fever, unspecified: Secondary | ICD-10-CM | POA: Diagnosis present

## 2023-08-12 LAB — POCT RAPID STREP A (OFFICE): Rapid Strep A Screen: NEGATIVE

## 2023-08-12 MED ORDER — PROMETHAZINE-DM 6.25-15 MG/5ML PO SYRP: 5.0000 mL | ORAL_SOLUTION | Freq: Every evening | ORAL | 0 refills | Status: AC | PRN

## 2023-08-12 MED ORDER — LIDOCAINE VISCOUS HCL 2 % MT SOLN: 15.0000 mL | OROMUCOSAL | 0 refills | Status: AC | PRN

## 2023-08-12 MED ORDER — BENZONATATE 100 MG PO CAPS: 100.0000 mg | ORAL_CAPSULE | Freq: Three times a day (TID) | ORAL | 0 refills | Status: AC | PRN

## 2023-08-12 NOTE — ED Triage Notes (Signed)
Pt c/o fever, sore throat, and body aches for 3 days. She has tried Dietitian at home

## 2023-08-12 NOTE — Discharge Instructions (Addendum)
Rapid strep throat test is negative.  The throat culture is pending and we will contact you in a few days if positive.  You most likely have a viral upper respiratory infection.  Symptoms should improve over the next week to 10 days.  If you develop chest pain or shortness of breath, go to the emergency room.  We have tested you today for COVID-19.  You will see the results in Mychart and we will call you with positive results.  Please stay home and isolate until you are fever free for 24 hours without fever reducing medication.  Some things that can make you feel better are: - Increased rest - Increasing fluid with water/sugar free electrolytes - Acetaminophen and ibuprofen as needed for fever/pain - Salt water gargling, chloraseptic spray and throat lozenges for sore throat - Lidocaine rinses as needed for sore throat - OTC guaifenesin (Mucinex) 600 mg twice daily for congestion - Saline sinus flushes or a neti pot - Humidifying the air -Tessalon Perles every 8 hours as needed for dry cough and cough syrup at night time as needed

## 2023-08-12 NOTE — ED Provider Notes (Signed)
[facility]    CSN: [id number] Arrival date & time: 08/12/23  0915      History   Chief Complaint Chief Complaint  Patient presents with   Fever   Sore Throat    HPI Patricia Holloway is a 42 y.o. female.   Patient presents today with 2 to 3-day history of tactile fevers, body aches and chills, congested cough, runny and stuffy nose, sore throat, headache, decreased appetite, and fatigue.  She denies shortness of breath or chest pain, chest tightness, ear pain, abdominal pain, nausea/vomiting, and diarrhea.  Has been taking TheraFlu for symptoms without much improvement.  Reports as a child, "I used to keep strep."    Past Medical History:  Diagnosis Date   Ectopic pregnancy    Partial small bowel obstruction (HCC) 09/2014   PONV (postoperative nausea and vomiting)    Postpartum depression    Sickle cell trait (HCC)    Spinal headache     Patient Active Problem List   Diagnosis Date Noted   Symptomatic cholelithiasis 09/27/2018   MDD (major depressive disorder), recurrent episode, moderate (HCC) 02/03/2017   GAD (generalized anxiety disorder) 02/03/2017   Partial small bowel obstruction (HCC) 10/08/2014   Abdominal pain    Cesarean delivery delivered 04/06/2014    Past Surgical History:  Procedure Laterality Date   CESAREAN SECTION N/A 04/06/2014   Procedure: REPEAT CESAREAN SECTION;  Surgeon: Essie Hart, MD;  Location: [facility];  Service: Obstetrics;  Laterality: N/A;   CESAREAN SECTION  2005; 2006; 2007; 2012; 2015   CHOLECYSTECTOMY N/A 09/27/2018   Procedure: LAPAROSCOPIC CHOLECYSTECTOMY;  Surgeon: Berna Bue, MD;  Location: [facility] OR;  Service: General;  Laterality: N/A;   ECTOPIC PREGNANCY SURGERY  2003   LAPAROSCOPIC CHOLECYSTECTOMY  09/27/2018   UNILATERAL SALPINGECTOMY Right 2004    OB History     Gravida  8   Para  5   Term  3   Preterm      AB  3   Living  5      SAB      IAB  1   Ectopic  2   Multiple      Live Births   5            Home Medications    Prior to Admission medications   Medication Sig Start Date End Date Taking? Authorizing Provider  benzonatate (TESSALON) 100 MG capsule Take 1 capsule (100 mg total) by mouth 3 (three) times daily as needed for cough. Do not take with alcohol or while driving or operating heavy machinery.  May cause drowsiness. 08/12/23  Yes Cathlean Marseilles A, NP  lidocaine (XYLOCAINE) 2 % solution Use as directed 15 mLs in the mouth or throat as needed for mouth pain. Gargle and spit as needed for throat pain 08/12/23  Yes Cathlean Marseilles A, NP  phentermine 30 MG capsule Take 30 mg by mouth every morning. Stopped taking around 08/05/23 due to feeling of "kidney issues"   Yes [provider]  promethazine-dextromethorphan (PROMETHAZINE-DM) 6.25-15 MG/5ML syrup Take 5 mLs by mouth at bedtime as needed for cough. Do not take with alcohol or while driving or operating heavy machinery.  May cause drowsiness. 08/12/23  Yes Valentino Nose, NP  cetirizine (ZYRTEC) 10 MG tablet Take 10 mg by mouth as needed for allergies.    [provider]    Family History Family History  Problem Relation Age of Onset   Heart disease Mother  Sickle cell trait Father     Social History Social History   Tobacco Use   Smoking status: Some Days    Current packs/day: 0.33    Average packs/day: 0.3 packs/day for 12.0 years (4.0 ttl pk-yrs)    Types: Cigarettes   Smokeless tobacco: Never  Vaping Use   Vaping status: Never Used  Substance Use Topics   Alcohol use: Yes    Alcohol/week: 1.0 standard drink of alcohol    Types: 1 Cans of beer per week   Drug use: No     Allergies   Pork-derived products   Review of Systems Review of Systems Per HPI  Physical Exam Triage Vital Signs ED Triage Vitals  Encounter Vitals Group     BP 08/12/23 0944 121/78     Systolic BP Percentile --      Diastolic BP Percentile --      Pulse Rate 08/12/23 0944 75      Resp 08/12/23 0944 17     Temp 08/12/23 0944 98.5 F (36.9 C)     Temp Source 08/12/23 0944 Oral     SpO2 08/12/23 0944 95 %     Weight --      Height --      Head Circumference --      Peak Flow --      Pain Score 08/12/23 0947 4     Pain Loc --      Pain Education --      Exclude from Growth Chart --    No data found.  Updated Vital Signs BP 121/78 (BP Location: Left Arm)   Pulse 75   Temp 98.5 F (36.9 C) (Oral)   Resp 17   LMP 07/06/2023 (Approximate)   SpO2 95%   Visual Acuity Right Eye Distance:   Left Eye Distance:   Bilateral Distance:    Right Eye Near:   Left Eye Near:    Bilateral Near:     Physical Exam Vitals and nursing note reviewed.  Constitutional:      General: She is not in acute distress.    Appearance: Normal appearance. She is not ill-appearing or toxic-appearing.  HENT:     Head: Normocephalic and atraumatic.     Right Ear: Ear canal and external ear normal. No drainage, swelling or tenderness. A middle ear effusion is present. Tympanic membrane is not erythematous.     Left Ear: Ear canal and external ear normal. No drainage, swelling or tenderness. A middle ear effusion is present. Tympanic membrane is not erythematous.     Nose: Congestion present. No rhinorrhea.     Mouth/Throat:     Mouth: Mucous membranes are moist.     Pharynx: Oropharynx is clear. Posterior oropharyngeal erythema present. No oropharyngeal exudate.     Tonsils: Tonsillar exudate present. 1+ on the right. 1+ on the left.  Eyes:     General: No scleral icterus.    Extraocular Movements: Extraocular movements intact.  Cardiovascular:     Rate and Rhythm: Normal rate and regular rhythm.  Pulmonary:     Effort: Pulmonary effort is normal. No respiratory distress.     Breath sounds: Normal breath sounds. No wheezing, rhonchi or rales.  Abdominal:     General: Abdomen is flat.  Musculoskeletal:     Cervical back: Normal range of motion and neck supple.   Lymphadenopathy:     Cervical: Cervical adenopathy present.  Skin:    General: Skin is warm and dry.  Coloration: Skin is not jaundiced or pale.     Findings: No erythema or rash.  Neurological:     Mental Status: She is alert and oriented to person, place, and time.     Motor: No weakness.  Psychiatric:        Behavior: Behavior is cooperative.      UC Treatments / Results  Labs (all labs ordered are listed, but only abnormal results are displayed) Labs Reviewed  CULTURE, GROUP A STREP ([facility])  SARS CORONAVIRUS 2 (TAT 6-24 HRS)  POCT RAPID STREP A (OFFICE)    EKG   Radiology No results found.  Procedures Procedures (including critical care time)  Medications Ordered in UC Medications - No data to display  Initial Impression / Assessment and Plan / UC Course  I have reviewed the triage vital signs and the nursing notes.  Pertinent labs & imaging results that were available during my care of the patient were reviewed by me and considered in my medical decision making (see chart for details).   Patient is well-appearing, normotensive, afebrile, not tachycardic, not tachypneic, oxygenating well on room air.    1 .Viral URI with cough 2. Acute pharyngitis, unspecified etiology Suspect viral etiology Vitals and exam are reassuring Rapid strep negative, given tonsillar exudate, history of strep, throat culture obtained and pending Supportive care discussed including lidocaine rinses, cough suppressant medication Work excuse provided Return and ER precautions discussed  The patient was given the opportunity to ask questions.  All questions answered to their satisfaction.  The patient is in agreement to this plan.    Final Clinical Impressions(s) / UC Diagnoses   Final diagnoses:  Viral URI with cough  Acute pharyngitis, unspecified etiology     Discharge Instructions      Rapid strep throat test is negative.  The throat culture is pending and we will  contact you in a few days if positive.  You most likely have a viral upper respiratory infection.  Symptoms should improve over the next week to 10 days.  If you develop chest pain or shortness of breath, go to the emergency room.  We have tested you today for COVID-19.  You will see the results in Mychart and we will call you with positive results.  Please stay home and isolate until you are fever free for 24 hours without fever reducing medication.  Some things that can make you feel better are: - Increased rest - Increasing fluid with water/sugar free electrolytes - Acetaminophen and ibuprofen as needed for fever/pain - Salt water gargling, chloraseptic spray and throat lozenges for sore throat - Lidocaine rinses as needed for sore throat - OTC guaifenesin (Mucinex) 600 mg twice daily for congestion - Saline sinus flushes or a neti pot - Humidifying the air -Tessalon Perles every 8 hours as needed for dry cough and cough syrup at night time as needed     ED Prescriptions     Medication Sig Dispense Auth. Provider   lidocaine (XYLOCAINE) 2 % solution Use as directed 15 mLs in the mouth or throat as needed for mouth pain. Gargle and spit as needed for throat pain 100 mL Cathlean Marseilles A, NP   benzonatate (TESSALON) 100 MG capsule Take 1 capsule (100 mg total) by mouth 3 (three) times daily as needed for cough. Do not take with alcohol or while driving or operating heavy machinery.  May cause drowsiness. 21 capsule Cathlean Marseilles A, NP   promethazine-dextromethorphan (PROMETHAZINE-DM) 6.25-15 MG/5ML syrup Take 5  mLs by mouth at bedtime as needed for cough. Do not take with alcohol or while driving or operating heavy machinery.  May cause drowsiness. 118 mL Valentino Nose, NP      PDMP not reviewed this encounter.   Valentino Nose, NP 08/12/23 1027

## 2023-08-13 LAB — SARS CORONAVIRUS 2 (TAT 6-24 HRS): SARS Coronavirus 2: NEGATIVE

## 2023-08-15 LAB — CULTURE, GROUP A STREP (THRC)

## 2024-02-21 ENCOUNTER — Encounter (INDEPENDENT_AMBULATORY_CARE_PROVIDER_SITE_OTHER): Payer: Self-pay

## 2024-02-25 ENCOUNTER — Ambulatory Visit: Admitting: Podiatry

## 2024-03-20 ENCOUNTER — Encounter (INDEPENDENT_AMBULATORY_CARE_PROVIDER_SITE_OTHER): Payer: Self-pay

## 2024-04-26 ENCOUNTER — Institutional Professional Consult (permissible substitution) (INDEPENDENT_AMBULATORY_CARE_PROVIDER_SITE_OTHER): Admitting: Family Medicine

## 2024-05-02 ENCOUNTER — Institutional Professional Consult (permissible substitution) (INDEPENDENT_AMBULATORY_CARE_PROVIDER_SITE_OTHER): Admitting: Family Medicine
